# Patient Record
Sex: Female | Born: 1955 | Marital: Married | Smoking: Never smoker
Health system: Southern US, Academic
[De-identification: ages and names within clinical notes are randomized; demographics above are authoritative.]

## PROBLEM LIST (undated history)

## (undated) DIAGNOSIS — E119 Type 2 diabetes mellitus without complications: Secondary | ICD-10-CM

## (undated) DIAGNOSIS — K5190 Ulcerative colitis, unspecified, without complications: Secondary | ICD-10-CM

## (undated) DIAGNOSIS — R058 Other specified cough: Secondary | ICD-10-CM

## (undated) DIAGNOSIS — J301 Allergic rhinitis due to pollen: Secondary | ICD-10-CM

## (undated) DIAGNOSIS — K219 Gastro-esophageal reflux disease without esophagitis: Secondary | ICD-10-CM

## (undated) DIAGNOSIS — M1990 Unspecified osteoarthritis, unspecified site: Secondary | ICD-10-CM

## (undated) DIAGNOSIS — I82409 Acute embolism and thrombosis of unspecified deep veins of unspecified lower extremity: Secondary | ICD-10-CM

## (undated) DIAGNOSIS — I10 Essential (primary) hypertension: Secondary | ICD-10-CM

## (undated) DIAGNOSIS — T464X5A Adverse effect of angiotensin-converting-enzyme inhibitors, initial encounter: Secondary | ICD-10-CM

## (undated) DIAGNOSIS — J45909 Unspecified asthma, uncomplicated: Secondary | ICD-10-CM

## (undated) DIAGNOSIS — F339 Major depressive disorder, recurrent, unspecified: Secondary | ICD-10-CM

## (undated) DIAGNOSIS — R05 Cough: Secondary | ICD-10-CM

## (undated) DIAGNOSIS — K5090 Crohn's disease, unspecified, without complications: Secondary | ICD-10-CM

## (undated) HISTORY — PX: ABDOMINAL HYSTERECTOMY: SHX81

## (undated) HISTORY — PX: CATARACT EXTRACTION: SUR2

## (undated) HISTORY — PX: BREAST BIOPSY: SHX20

## (undated) HISTORY — PX: COLONOSCOPY: SHX174

## (undated) HISTORY — DX: Essential (primary) hypertension: I10

## (undated) HISTORY — DX: Unspecified asthma, uncomplicated: J45.909

## (undated) HISTORY — PX: KNEE SURGERY: SHX244

## (undated) HISTORY — DX: Other specified cough: R05.8

## (undated) HISTORY — DX: Gastro-esophageal reflux disease without esophagitis: K21.9

## (undated) HISTORY — DX: Morbid (severe) obesity due to excess calories: E66.01

## (undated) HISTORY — DX: Cough: R05

## (undated) HISTORY — DX: Type 2 diabetes mellitus without complications: E11.9

## (undated) HISTORY — DX: Major depressive disorder, recurrent, unspecified: F33.9

## (undated) HISTORY — DX: Adverse effect of angiotensin-converting-enzyme inhibitors, initial encounter: T46.4X5A

## (undated) HISTORY — DX: Ulcerative colitis, unspecified, without complications: K51.90

## (undated) HISTORY — DX: Allergic rhinitis due to pollen: J30.1

## (undated) HISTORY — PX: SHOULDER SURGERY: SHX246

## (undated) HISTORY — DX: Acute embolism and thrombosis of unspecified deep veins of unspecified lower extremity: I82.409

## (undated) HISTORY — DX: Type 2 diabetes mellitus without complications (CMS HCC): E11.9

## (undated) HISTORY — PX: HX SHOULDER SURGERY: 2100001311

## (undated) HISTORY — PX: HX KNEE SURGERY: 2100001320

## (undated) HISTORY — PX: HX CATARACT REMOVAL: SHX102

## (undated) HISTORY — DX: Crohn's disease, unspecified, without complications (CMS HCC): K50.90

## (undated) HISTORY — PX: HX HYSTERECTOMY: SHX81

---

## 1974-05-28 DIAGNOSIS — K51919 Ulcerative colitis, unspecified with unspecified complications: Secondary | ICD-10-CM | POA: Insufficient documentation

## 2014-09-07 LAB — CBC AND DIFFERENTIAL
HCT: 37 % (ref 36–46)
Hemoglobin: 11.4 g/dL — AB (ref 12.0–16.0)
NEUTROS ABS: 12 /uL
Platelets: 368 10*3/uL (ref 150–399)
WBC: 14.8 10^3/mL

## 2014-12-08 LAB — HEPATIC FUNCTION PANEL
ALK PHOS: 50 U/L (ref 25–125)
ALT: 20 U/L (ref 7–35)
AST: 14 U/L (ref 13–35)
Bilirubin, Total: 0.2 mg/dL

## 2014-12-08 LAB — BASIC METABOLIC PANEL
BUN: 27 mg/dL — AB (ref 4–21)
Creatinine: 1.4 mg/dL — AB (ref <=1.1)
GLUCOSE: 173 mg/dL
POTASSIUM: 4.7 mmol/L (ref 3.4–5.3)
SODIUM: 139 mmol/L (ref 137–147)

## 2015-02-23 LAB — HEMOGLOBIN A1C: HEMOGLOBIN A1C: 6.7

## 2015-02-23 LAB — HEPATIC FUNCTION PANEL
ALK PHOS: 75 U/L (ref 25–125)
ALT: 19 U/L (ref 7–35)
AST: 16 U/L (ref 13–35)
Bilirubin, Total: 0.2 mg/dL

## 2015-02-23 LAB — BASIC METABOLIC PANEL
BUN: 21 mg/dL (ref 4–21)
Creatinine: 1.2 mg/dL — AB (ref <=1.1)
Glucose: 122 mg/dL
Potassium: 4.5 mmol/L (ref 3.4–5.3)
Sodium: 139 mmol/L (ref 137–147)

## 2015-06-01 LAB — LIPID PANEL
CHOLESTEROL: 152 mg/dL (ref 0–200)
HDL: 51 mg/dL (ref 35–70)
LDL CALC: 69 mg/dL
Triglycerides: 159 mg/dL (ref 40–160)

## 2015-06-01 LAB — BASIC METABOLIC PANEL
BUN: 17 mg/dL (ref 4–21)
Creatinine: 1.2 mg/dL — AB (ref <=1.1)
GLUCOSE: 102 mg/dL
Potassium: 4.3 mmol/L (ref 3.4–5.3)
SODIUM: 141 mmol/L (ref 137–147)

## 2015-06-01 LAB — HEPATIC FUNCTION PANEL
ALT: 24 U/L (ref 7–35)
AST: 18 U/L (ref 13–35)
Alkaline Phosphatase: 89 U/L (ref 25–125)
Bilirubin, Total: 0.2 mg/dL

## 2015-06-01 LAB — HEMOGLOBIN A1C: Hemoglobin A1C: 6.7

## 2015-09-19 LAB — BASIC METABOLIC PANEL
BUN: 18 mg/dL (ref 4–21)
CREATININE: 1.2 mg/dL — AB (ref <=1.1)
Glucose: 77 mg/dL
SODIUM: 141 mmol/L (ref 137–147)

## 2015-09-19 LAB — HEPATIC FUNCTION PANEL
ALK PHOS: 111 U/L (ref 25–125)
ALT: 24 U/L (ref 7–35)
AST: 22 U/L (ref 13–35)
Bilirubin, Total: 0.4 mg/dL

## 2015-09-19 LAB — LIPID PANEL
CHOLESTEROL: 141 mg/dL (ref 0–200)
HDL: 47 mg/dL (ref 35–70)
LDL Cholesterol: 62 mg/dL
Triglycerides: 161 mg/dL — AB (ref 40–160)

## 2015-09-19 LAB — HEMOGLOBIN A1C: HEMOGLOBIN A1C: 6.4

## 2016-01-10 LAB — HEMOGLOBIN A1C: Hemoglobin A1C: 5.9

## 2016-01-10 LAB — LIPID PANEL
CHOLESTEROL: 132 mg/dL (ref 0–200)
HDL: 36 mg/dL (ref 35–70)
LDL CALC: 69 mg/dL
Triglycerides: 134 mg/dL (ref 40–160)

## 2016-01-10 LAB — BASIC METABOLIC PANEL
BUN: 25 mg/dL — AB (ref 4–21)
CREATININE: 1.2 mg/dL — AB (ref <=1.1)
Glucose: 81 mg/dL
POTASSIUM: 4.4 mmol/L (ref 3.4–5.3)
Sodium: 142 mmol/L (ref 137–147)

## 2016-01-10 LAB — VITAMIN D 25 HYDROXY (VIT D DEFICIENCY, FRACTURES): VIT D 25 HYDROXY: 44

## 2016-01-10 LAB — MICROALBUMIN, URINE: MICROALB UR: 12

## 2016-03-15 ENCOUNTER — Ambulatory Visit (HOSPITAL_BASED_OUTPATIENT_CLINIC_OR_DEPARTMENT_OTHER): Payer: BC Managed Care – PPO | Admitting: Rheumatology

## 2016-03-15 ENCOUNTER — Ambulatory Visit (HOSPITAL_BASED_OUTPATIENT_CLINIC_OR_DEPARTMENT_OTHER): Payer: BC Managed Care – PPO

## 2016-03-15 ENCOUNTER — Ambulatory Visit
Admission: RE | Admit: 2016-03-15 | Discharge: 2016-03-15 | Disposition: A | Discharge status: Home / Self Care | Payer: BC Managed Care – PPO | Source: Ambulatory Visit | Attending: Rheumatology | Admitting: Rheumatology

## 2016-03-15 ENCOUNTER — Other Ambulatory Visit (HOSPITAL_BASED_OUTPATIENT_CLINIC_OR_DEPARTMENT_OTHER): Payer: Self-pay | Admitting: Family

## 2016-03-15 ENCOUNTER — Encounter (HOSPITAL_BASED_OUTPATIENT_CLINIC_OR_DEPARTMENT_OTHER): Payer: Self-pay | Admitting: Family

## 2016-03-15 ENCOUNTER — Ambulatory Visit (HOSPITAL_BASED_OUTPATIENT_CLINIC_OR_DEPARTMENT_OTHER): Payer: BC Managed Care – PPO | Admitting: Family

## 2016-03-15 VITALS — BP 140/80 | HR 73 | Temp 97.5°F | Ht 59.57 in | Wt 186.7 lb

## 2016-03-15 DIAGNOSIS — Z79899 Other long term (current) drug therapy: Secondary | ICD-10-CM | POA: Insufficient documentation

## 2016-03-15 DIAGNOSIS — Z794 Long term (current) use of insulin: Secondary | ICD-10-CM | POA: Insufficient documentation

## 2016-03-15 DIAGNOSIS — R768 Other specified abnormal immunological findings in serum: Secondary | ICD-10-CM | POA: Insufficient documentation

## 2016-03-15 DIAGNOSIS — K529 Noninfective gastroenteritis and colitis, unspecified: Secondary | ICD-10-CM

## 2016-03-15 DIAGNOSIS — M25572 Pain in left ankle and joints of left foot: Secondary | ICD-10-CM

## 2016-03-15 DIAGNOSIS — M2550 Pain in unspecified joint: Secondary | ICD-10-CM

## 2016-03-15 DIAGNOSIS — Z6837 Body mass index (BMI) 37.0-37.9, adult: Secondary | ICD-10-CM

## 2016-03-15 DIAGNOSIS — M25571 Pain in right ankle and joints of right foot: Secondary | ICD-10-CM

## 2016-03-15 DIAGNOSIS — Z7951 Long term (current) use of inhaled steroids: Secondary | ICD-10-CM | POA: Insufficient documentation

## 2016-03-15 DIAGNOSIS — E1140 Type 2 diabetes mellitus with diabetic neuropathy, unspecified: Secondary | ICD-10-CM | POA: Insufficient documentation

## 2016-03-15 DIAGNOSIS — K5090 Crohn's disease, unspecified, without complications: Secondary | ICD-10-CM | POA: Insufficient documentation

## 2016-03-15 DIAGNOSIS — Z7952 Long term (current) use of systemic steroids: Secondary | ICD-10-CM | POA: Insufficient documentation

## 2016-03-15 DIAGNOSIS — Z791 Long term (current) use of non-steroidal anti-inflammatories (NSAID): Secondary | ICD-10-CM | POA: Insufficient documentation

## 2016-03-15 DIAGNOSIS — J45909 Unspecified asthma, uncomplicated: Secondary | ICD-10-CM | POA: Insufficient documentation

## 2016-03-15 DIAGNOSIS — M79641 Pain in right hand: Secondary | ICD-10-CM | POA: Insufficient documentation

## 2016-03-15 DIAGNOSIS — M79642 Pain in left hand: Secondary | ICD-10-CM | POA: Insufficient documentation

## 2016-03-15 LAB — CBC WITH DIFF
BASOPHIL #: 0.1 x10ˆ3/uL (ref 0.00–0.20)
BASOPHIL %: 1 %
EOSINOPHIL #: 1.02 x10ˆ3/uL — ABNORMAL HIGH (ref 0.00–0.50)
EOSINOPHIL %: 12 %
HCT: 36.5 % (ref 33.5–45.2)
HGB: 12.2 g/dL (ref 11.2–15.2)
LYMPHOCYTE #: 2.68 x10ˆ3/uL (ref 1.00–4.80)
LYMPHOCYTE %: 31 %
MCH: 29.8 pg (ref 27.4–33.0)
MCHC: 33.3 g/dL (ref 32.5–35.8)
MCV: 89.5 fL (ref 78.0–100.0)
MONOCYTE #: 0.63 x10ˆ3/uL (ref 0.30–1.00)
MONOCYTE %: 7 %
MPV: 9.2 fL (ref 7.5–11.5)
NEUTROPHIL #: 4.12 x10ˆ3/uL (ref 1.50–7.70)
NEUTROPHIL %: 48 %
PLATELETS: 303 x10ˆ3/uL (ref 140–450)
RBC: 4.08 x10ˆ6/uL (ref 3.63–4.92)
RDW: 13.5 % (ref 12.0–15.0)
WBC: 8.6 x10ˆ3/uL (ref 3.5–11.0)

## 2016-03-15 LAB — BUN: BUN: 25 mg/dL (ref 8–25)

## 2016-03-15 LAB — ALT (SGPT): ALT (SGPT): 13 U/L (ref <55)

## 2016-03-15 LAB — AST (SGOT): AST (SGOT): 21 U/L (ref 8–41)

## 2016-03-15 LAB — CREATININE WITH EGFR
CREATININE: 1.31 mg/dL — ABNORMAL HIGH (ref 0.49–1.10)
ESTIMATED GFR: 44 mL/min/1.73mˆ2 — ABNORMAL LOW (ref >59)

## 2016-03-15 LAB — C-REACTIVE PROTEIN(CRP),INFLAMMATION: CRP INFLAMMATION: 3.3 mg/L (ref <=8.0)

## 2016-03-15 LAB — ALBUMIN: ALBUMIN: 3.2 g/dL — ABNORMAL LOW (ref 3.4–4.8)

## 2016-03-15 LAB — CYCLIC CITRULLINATED PEPTIDE ANTIBODIES, IGG, SERUM: CYCLIC CITRULLINATED PEPTIDE ANTIBODY IGG QUANT: 0.7 U/mL (ref <5.0)

## 2016-03-15 LAB — SEDIMENTATION RATE: ERYTHROCYTE SEDIMENTATION RATE (ESR): 19 mm/h (ref 0–30)

## 2016-03-15 NOTE — H&P (Signed)
Requesting Physician: Wille Glaser, MD  History of Present Illness  Cathy Bennett is a 60 y.o. female who presents with a chief complaint of New Patient   to clinic.  Patient presents today for further evaluation of a positive ANA.  Patient presented to Dr. 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 Upon workup--ANA was discovered. Patient states she was previously elevated for + ANA in 2006 with no concern for lupus discovered.  Patient states she does have a history of Crohn's disease diagnosed in her late 71s.  She has been on various therapies throughout the years-- unable to tolerate Imuran or methotrexate.  States she was on sulfasalazine for many years with good control of inflammatory bowel symptoms.  Chronic prednisone use of varying doses for the past 40 years--was finally able to d/c in April 2016. It was at that time that her joint symptoms started.  She is currently seeing Dr. Mikeal Hawthorne for GI. Patient has recently moved to the area and has only been seen by Dr. Rito Ehrlich for a brief period.  Currently on Asacol and Protonix for her Crohn's disease. States Dr. Rito Ehrlich has mentioned possibly going back on Sulfasalazine.  She reports her Crohn's being well controlled.  She does not report any bleeding or diarrhea.  Did note an upset stomach last night following dinner with episodes of diarrhea--but otherwise states symptoms have been well controlled. Feeling better today.  She tells me her last colonoscopy was in October of 2016 and there was no concern for active Crohn's at that time.  In regards to her joints she says that it is her hands that are causing her the most trouble.  She describes increased pain and decreased range of motion.  Reports swelling to the MCPs.  She does report pain and numbness to her feet, but states she diabetic neuropathy. No reported back pain. She has morning stiffness lasting around 1 hour to her hands.  She says her hands do not feel better with  activity--hurt all day long.  She currently has been using ibuprofen with some relief. States she tolerates that okay and has not had any problems with her stomach while using ibuprofen.      Past History  Current Outpatient Prescriptions   Medication Sig    albuterol (PROVENTIL) 2.5 mg/0.5 mL Inhalation Solution for Nebulization 2.5 mg by Nebulization route Three times a day    buPROPion (WELLBUTRIN SR) 200 mg Oral Tablet Sustained Release 12 hr Take 100 mg by mouth Twice daily Takes 300 mg a day    cholecalciferol, vitamin D3, 1,000 unit Oral Tablet Take 1,000 Units by mouth Once a day    fluticasone (FLONASE) 50 mcg/actuation Nasal Spray, Suspension 1 Spray by Each Nostril route Once a day    fluticasone-salmeterol (ADVAIR) 100-50 mcg/dose Inhalation Disk with Device oral diskus inhaler Take 1 INHALATION by inhalation Once a day    insulin glargine (LANTUS) 100 unit/mL Subcutaneous injection (vial) by Subcutaneous route Once a day    insulin regular human 100 unit/mL Injection Solution by Subcutaneous route Three times daily before meals    Mesalamine (ASACOL HD) 800 mg Oral Tablet, Delayed Release (E.C.) Take 1,600 mg by mouth Three times a day 3 tablets in AM, 3 tabs in PM    metoprolol (LOPRESSOR) 100 mg Oral Tablet Take 100 mg by mouth Twice daily    montelukast (SINGULAIR) 10 mg Oral Tablet Take 10 mg by mouth Every evening    pantoprazole (PROTONIX) 40 mg Oral  Tablet, Delayed Release (E.C.) Take 40 mg by mouth Once a day    raNITIdine (ZANTAC) 300 mg Oral Tablet Take 300 mg by mouth Every evening     Allergies   Allergen Reactions    Benicar [Olmesartan]     Ceftin [Cefuroxime Axetil]     Codeine     Imuran [Azathioprine]     Indocin [Indomethacin]     Methotrexate      Past Medical History:   Diagnosis Date    Asthma     Crohn disease (HCC)     Diabetes mellitus, type 2 (HCC)          Past Surgical History:   Procedure Laterality Date    HX CATARACT REMOVAL      HX CESAREAN SECTION       HX HYSTERECTOMY      HX KNEE SURGERY Left     arthroscopy    HX SHOULDER SURGERY Left     rotator cuff repair         Family History  Family Medical History     None          Social History  Social History     Social History    Marital status: Married     Spouse name: N/A    Number of children: N/A    Years of education: N/A     Social History Main Topics    Smoking status: Never Smoker    Smokeless tobacco: Never Used    Alcohol use No    Drug use: Not on file    Sexual activity: Not on file     Other Topics Concern    Not on file     Social History Narrative    No narrative on file       Review of Systems    Bold is positive. Rest are negative    Constitutional: fevers, weight loss, weight gain, weight fluctuates, excessive fatigue, poor/unrefreshed sleep/difficulty sleeping, -sleep study, - snore  Eyes: Dryness (feels like something in the eye), hx of uveitis, vision problems (blurred or double vision) , glaucoma, cataracts--removed, macular degeneration, acute vision loss  Ears, nose, mouth, throat, and face: nosebleeds, sores in mouth, mouth dryness, sores in nose, hair loss, hearing loss, sinusitis/sinus issues, cavities or dentures  Respiratory: shortness of breath, cough, hemoptysis, hx of pleuritis, hx of pleural effusion, hx of COPD or asthma  Cardiovascular: chest pain, hx of pericardial effusion, hx of heart failure, hx of cardiac disease - stents  Gastrointestinal: difficulty swallowing, heart burn, diarrhea, blood in stool, constipation, Irritable bowel syndrome, inflammatory bowel syndrome --Crohn's  Genitourinary: blood in urine, hx of renal failure,  kidney stones  Integument: photosensitivity, hx of erythema nodosum, rash, hx of psoriasis  Hematologic/lymphatic: hx of blood clots, currently on blood thinners, hx of malignancy, currently on chemotherapy or radiation, hx of radiation, hx of chemotherapy  Musculoskeletal: as in HPI  Neurological: tingling, numbness--neuropathy, hx of  strokes, seizures, weakness, headaches - (migraines)  Behavioral/Psych: depression, anxiety, other psychiatric disorder  Endocrine: hx of diabetes, hx of thyroid disease                Other: pregnancy loss, raynaud's        Examination  Vitals: BP 140/80   Pulse 73   Temp 36.4 C (97.5 F) (Thermal Scan)    Ht 1.513 m (4' 11.57")   Wt 84.7 kg (186 lb 11.7 oz)   SpO2 98%   BMI  37 kg/m2  General: appears in good health, no acute distress  Eyes: Conjunctiva clear. Pupils equal and round.   HEENT: Mouth without ulceration.  Oral pooling is good. No sinus tenderness, no oral ulcers or lesions   Cardiovascular: Normal. Heart sounds are not distant  Lungs: clear to auscultation bilaterally. No wheezes  Abdomen: soft, non-tender and bowel sounds normal   Extremities: no cyanosis or edema, good pulses  Joints: Full ROM and no synovitis of the upper or lower extremities except as noted .   Joints examined: Hands, wrists, elbows, shoulders, neck, hips, knees, ankles, feet; abnormalities as below:   Hands: mild ulnar drift    R Full ROM, not painful, 2-3 MCP fullness, + MCP tenderness, no heberden's or bouchard's       L Full ROM, not painful, 2-3 MCP fullness, + MCP tenderness, no heberden's or bouchard's   Shoulders:    R decreased ROM, + painful, + tenderness    L Full ROM, not painful, no tenderness    Feet:    R Full ROM, no synovitis, no tenderness, metatarsal squeeze negative    L Full ROM, no synovitis, no tenderness, metatarsal squeeze negative, 2nd digit hammer toe  Back : No tenderness along the spine  Skin: No rashes or lesions,  (looked for malar rash, sclerodactyly, heliotrope, psoriasis, sclerodactyly, digital pits, digital ulcers, gottrons papules, nail pitting, periungal erythema, petechia, purpura and tophi)   Neurologic: Up to exam table without assistance  Proximal muscle strength grossly intact.   Psychiatric: affect normal    Outside Records:  December 21, 2015 ANA by a phase positive RA latex is less than  10. Negative.    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  Atrophy of the musculature of the supraspinatus and subscapularis is significant.  X-ray shows high riding humeral head, previous distal clavicle excision, and subacromial decompression.  We discussed with Cathy Bennett that she has a rotator cuff arthropathy that is amenable in the future but if it becomes painful enough to do a reverse shoulder replacement repair.    Diagnosis and Plan    1. Arthralgia, unspecified joint    2. Inflammatory bowel disease      Cathy Bennett is a 60 y/o female who presents today for further evaluation of + ANA and bilateral hand pain with swelling. Patient has a long history of Crohn's disease. Currently on Asacol and Protonix. Was weaned off chronic prednisone in April 2016 and states that is when pain in hands started.     On exam, tenderness to MCPs. Subtle fullness to bilateral 2-3rd MCPs.     Outside records reveal negative RF, + ANA via IFA. My current suspicion for connective tissue disease is low as patient does not present with any concerning features at this time. I suspect ANA likely related to diagnosis of Crohn's as ANA can present in inflammatory bowel disease. Will hold off on checking serologies today-- can be considered in the future if indicated. Discussed with patient that when inflammatory bowel disease is active--can lead to joint manifestations of inflammatory arthritis. Patient reports doing well with Sulfasalazine previously. Will complete further workup today including blood work and imaging of hands and feet. Patient signed release to collect most recent records from GI.     I would recommend DEXA per PCP if not previously checked given hx of long  term steroid use for osteoporosis screening.    RTC in 2-3 weeks to review results.       Orders  Placed This Encounter    XR FEET WT BEARING BILATERAL    XR HAND AND WRIST BI, ARTHRITIS SERIES    ALBUMIN    ALT (SGPT)    AST (SGOT)    BUN    CBC/DIFF    CREATININE    C-REACTIVE PROTEIN(CRP),INFLAMMATION    CYCLIC CITRULLINATED PEPTIDE ANTIBODIES, IGG, SERUM    SEDIMENTATION RATE    C3 COMPLEMENT    C4 COMPLEMENT    HEP-2 SUBSTRATE ANTINUCLEAR ANTIBODIES (ANA), SERUM     The patient was seen independently with co signing facutly present in clinic .  Benard Rink, APRN,NP-C  03/15/2016, 09:59      This note was partially generated using MModal Fluency Direct system, and there may be some incorrect words, spellings, and punctuation that were not noted in checking the note before saving.

## 2016-03-16 LAB — C4 COMPLEMENT, SERUM: C4 COMPLEMENT: 32 mg/dL (ref 12–39)

## 2016-03-19 ENCOUNTER — Encounter (HOSPITAL_BASED_OUTPATIENT_CLINIC_OR_DEPARTMENT_OTHER): Payer: Self-pay | Admitting: Family

## 2016-03-28 ENCOUNTER — Telehealth (HOSPITAL_BASED_OUTPATIENT_CLINIC_OR_DEPARTMENT_OTHER): Payer: Self-pay | Admitting: Family

## 2016-03-28 NOTE — Telephone Encounter (Signed)
Office note received dated for 03/14/2016: Dr. Mikeal Hawthorne MD    The patient is a 60 year old female who presents for a follow-up visit.  The patient's primary care physician is Dr. for bath.  Patient is being followed for follow-up of Crohn's and colitis.  Clerance Lav is doing well.  No problems or complaints.  No diarrhea, abdominal pain, bleeding or anything else.  She is feeling good.  She is taking her 6 Asacol at day.  She is taking her Protonix.  She is taking 40 b.i.d. but actually she has had no reflux symptoms and it might be from other drugs.  I am going to have her reduce it to once a day.  She continues to get it once a day before the evening meal then my thought would be to maybe even reduced to 20 mg per day.  She is going to experiment with that.  Everything is going to stay the same for her Crohn's.  At this point we are not going to change her meds.  I am going to do a CBC today but I do not think that we really need to do anything else.

## 2016-04-06 ENCOUNTER — Ambulatory Visit: Payer: BC Managed Care – PPO | Attending: Rheumatology | Admitting: Family

## 2016-04-06 ENCOUNTER — Encounter (HOSPITAL_BASED_OUTPATIENT_CLINIC_OR_DEPARTMENT_OTHER): Payer: Self-pay | Admitting: Family

## 2016-04-06 VITALS — BP 118/60 | HR 80 | Temp 97.7°F | Wt 185.8 lb

## 2016-04-06 DIAGNOSIS — M0760 Enteropathic arthropathies, unspecified site: Secondary | ICD-10-CM

## 2016-04-06 DIAGNOSIS — Z7952 Long term (current) use of systemic steroids: Secondary | ICD-10-CM | POA: Insufficient documentation

## 2016-04-06 DIAGNOSIS — K5090 Crohn's disease, unspecified, without complications: Secondary | ICD-10-CM | POA: Insufficient documentation

## 2016-04-06 DIAGNOSIS — Z791 Long term (current) use of non-steroidal anti-inflammatories (NSAID): Secondary | ICD-10-CM | POA: Insufficient documentation

## 2016-04-06 DIAGNOSIS — M8580 Other specified disorders of bone density and structure, unspecified site: Secondary | ICD-10-CM | POA: Insufficient documentation

## 2016-04-06 DIAGNOSIS — Z794 Long term (current) use of insulin: Secondary | ICD-10-CM | POA: Insufficient documentation

## 2016-04-06 DIAGNOSIS — Z79899 Other long term (current) drug therapy: Secondary | ICD-10-CM | POA: Insufficient documentation

## 2016-04-06 DIAGNOSIS — M2550 Pain in unspecified joint: Secondary | ICD-10-CM | POA: Insufficient documentation

## 2016-04-06 DIAGNOSIS — K639 Disease of intestine, unspecified: Secondary | ICD-10-CM

## 2016-04-06 DIAGNOSIS — Z6836 Body mass index (BMI) 36.0-36.9, adult: Secondary | ICD-10-CM

## 2016-04-06 MED ORDER — DICLOFENAC 1 % TOPICAL GEL
2.0000 g | Freq: Three times a day (TID) | CUTANEOUS | 2 refills | Status: AC | PRN
Start: 2016-04-06 — End: ?

## 2016-04-06 NOTE — Progress Notes (Signed)
Subjective  Cathy MainMaryann Bennett is a 60 y.o. year old female who presents for Lab Results   to clinic. Patient presents today for her first follow up appointment since establishing care on 03/15/2016. I have reviewed the initial history and ROS from first visit. Lab results as detailed below were reviewed with the patient today. States symptoms remain relatively stable. Uses ibuprofen regularly for the joint pain that primarily only affects her hands. States the hands continue to swell every few days. Feels ok with activity. States the ibuprofen does not cause problems with her Crohn's. Patient states her husband may have to transfer work locations and she may be moving by February.      Current Outpatient Prescriptions   Medication Sig    albuterol (PROVENTIL) 2.5 mg/0.5 mL Inhalation Solution for Nebulization 2.5 mg by Nebulization route Three times a day    buPROPion (WELLBUTRIN SR) 200 mg Oral Tablet Sustained Release 12 hr Take 100 mg by mouth Twice daily Takes 300 mg a day    cholecalciferol, vitamin D3, 1,000 unit Oral Tablet Take 1,000 Units by mouth Once a day    diclofenac sodium (VOLTAREN) 1 % Gel 2 g by Apply Topically route Three times a day as needed    fluticasone (FLONASE) 50 mcg/actuation Nasal Spray, Suspension 1 Spray by Each Nostril route Once a day    fluticasone-salmeterol (ADVAIR) 100-50 mcg/dose Inhalation Disk with Device oral diskus inhaler Take 1 INHALATION by inhalation Once a day    insulin glargine (LANTUS) 100 unit/mL Subcutaneous injection (vial) by Subcutaneous route Once a day    insulin regular human 100 unit/mL Injection Solution by Subcutaneous route Three times daily before meals    Mesalamine (ASACOL HD) 800 mg Oral Tablet, Delayed Release (E.C.) Take 1,600 mg by mouth Three times a day 3 tablets in AM, 3 tabs in PM    metoprolol (LOPRESSOR) 100 mg Oral Tablet Take 100 mg by mouth Twice daily    montelukast (SINGULAIR) 10 mg Oral Tablet Take 10 mg by mouth Every evening      pantoprazole (PROTONIX) 40 mg Oral Tablet, Delayed Release (E.C.) Take 40 mg by mouth Once a day    raNITIdine (ZANTAC) 300 mg Oral Tablet Take 300 mg by mouth Every evening       Objective  Vitals:    04/06/16 0959   BP: 118/60   Pulse: 80   Temp: 36.5 C (97.7 F)   TempSrc: Thermal Scan   SpO2: 98%   Weight: 84.3 kg (185 lb 13.6 oz)     General: appears in good health, no acute distress  Eyes: Conjunctiva clear. Pupils equal and round.   HEENT: Mouth without ulceration.  Oral pooling is good. No sinus tenderness, no oral ulcers or lesions   Cardiovascular: Normal. Heart sounds are not distant  Lungs: clear to auscultation bilaterally. No wheezes  Abdomen: soft, non-tender and bowel sounds normal   Extremities: no cyanosis or edema, good pulses  Joints: Full ROM and no synovitis of the upper or lower extremities except as noted .   Joints examined: Hands, wrists, elbows, shoulders, neck, hips, knees, ankles, feet; abnormalities as below:                         Hands: mild ulnar drift                          R Full ROM, not painful, 2-3  MCP thickening, + MCP tenderness, no heberden's or bouchard's                                           L Full ROM, not painful, 2-3 MCP thickening, + MCP tenderness, no heberden's or bouchard's                         Shoulders:                          R decreased ROM, + painful, + tenderness                         L Full ROM, not painful, no tenderness                          Feet:                          R Full ROM, no synovitis, no tenderness, metatarsal squeeze negative                          L Full ROM, no synovitis, no tenderness, metatarsal squeeze negative, 2nd digit hammer toe  Back : No tenderness along the spine  Skin: No rashes or lesions,  (looked for malar rash, sclerodactyly, heliotrope, psoriasis, sclerodactyly, digital pits, digital ulcers, gottrons papules, nail pitting, periungal erythema, petechia, purpura and tophi)   Neurologic: Up to exam table  without assistance  Proximal muscle strength grossly intact.   Psychiatric: affect normal    Appointment on 03/15/2016   Component Date Value Ref Range Status    ALBUMIN 03/15/2016 3.2* 3.4 - 4.8 g/dL Final    ALT (SGPT) 40/98/1191 13  <55 U/L Final    AST (SGOT) 03/15/2016 21  8 - 41 U/L Final    BUN 03/15/2016 25  8 - 25 mg/dL Final    CREATININE 47/82/9562 1.31* 0.49 - 1.10 mg/dL Final    ESTIMATED GFR 03/15/2016 44* >59 mL/min/1.31m2 Final    CRP INFLAMMATION 03/15/2016 3.3  <=8.0 mg/L Final    CYCLIC CITRULLINATED PEPTIDE ANTIB* 03/15/2016 0.7  <5.0 U/mL Final    ERYTHROCYTE SEDIMENTATION RATE (ES* 03/15/2016 19  0 - 30 mm/hr Final    C3 COMPLEMENT 03/15/2016 115  81 - 157 mg/dL Final    C4 COMPLEMENT 03/15/2016 32  12 - 39 mg/dL Final    ANA INTERPRETATION 03/15/2016 Negative  Negative Final    WBC 03/15/2016 8.6  3.5 - 11.0 x103/uL Final    RBC 03/15/2016 4.08  3.63 - 4.92 x106/uL Final    HGB 03/15/2016 12.2  11.2 - 15.2 g/dL Final    HCT 13/12/6576 36.5  33.5 - 45.2 % Final    MCV 03/15/2016 89.5  78.0 - 100.0 fL Final    MCH 03/15/2016 29.8  27.4 - 33.0 pg Final    MCHC 03/15/2016 33.3  32.5 - 35.8 g/dL Final    RDW 46/96/2952 13.5  12.0 - 15.0 % Final    PLATELETS 03/15/2016 303  140 - 450 x103/uL Final    MPV 03/15/2016 9.2  7.5 - 11.5 fL Final    NEUTROPHIL % 03/15/2016 48  % Final  LYMPHOCYTE % 03/15/2016 31  % Final    MONOCYTE % 03/15/2016 7  % Final    EOSINOPHIL % 03/15/2016 12  % Final    BASOPHIL % 03/15/2016 1  % Final    NEUTROPHIL # 03/15/2016 4.12  1.50 - 7.70 x103/uL Final    LYMPHOCYTE # 03/15/2016 2.68  1.00 - 4.80 x103/uL Final    MONOCYTE # 03/15/2016 0.63  0.30 - 1.00 x103/uL Final    EOSINOPHIL # 03/15/2016 1.02* 0.00 - 0.50 x103/uL Final    BASOPHIL # 03/15/2016 0.10  0.00 - 0.20 x103/uL Final     XR FEET BILATERAL 2 VIEW performed on 03/15/2016 11:44 AM     INDICATION: 60 years old Female; M25.50: Arthralgia, unspecified joint  K52.9:  Inflammatory bowel disease     TECHNIQUE: 2 views of each foot; 1 images     COMPARISON: None     FINDINGS: Osteopenia is noted. There are no articular erosions.  Degenerative arthrosis is present at the tarsometatarsal joints. There is  hindfoot valgus. No acute fracture is noted.     IMPRESSION:  1. No articular erosions.  2. Degenerative arthrosis of the tarsometatarsal joints.  XR HAND AND WRIST BI, ARTHRITIS SERIES performed on 03/15/2016 11:44 AM.     REASON FOR EXAM:  M25.50: Arthralgia, unspecified joint  K52.9: Inflammatory bowel disease     TECHNIQUE: 2 views/2 images submitted for interpretation.     COMPARISON:  None available     FINDINGS:  Cystic articular erosions are seen at the second through fifth  metacarpal heads bilaterally. No articular erosion seen in the wrists, but  significant joint space loss and subchondral sclerosis are detected at the  bilateral lunocapitate articulations. There is mild left and moderate right  first CMC degenerative arthropathy. No fracture is seen. There is  subluxations at the second through fourth MCPs bilaterally.     IMPRESSION:  Cystic articular erosions and subluxation bilaterally at the second through  fifth MCPs, which are nonspecific but could be seen with inflammatory bowel  disease related inflammatory arthropathy or rheumatoid arthritis in the  appropriate clinical setting.  XR FEET BILATERAL 2 VIEW performed on 03/15/2016 11:44 AM     INDICATION: 60 years old Female; M25.50: Arthralgia, unspecified joint  K52.9: Inflammatory bowel disease     TECHNIQUE: 2 views of each foot; 1 images     COMPARISON: None     FINDINGS: Osteopenia is noted. There are no articular erosions.  Degenerative arthrosis is present at the tarsometatarsal joints. There is  hindfoot valgus. No acute fracture is noted.     IMPRESSION:  1. No articular erosions.  2. Degenerative arthrosis of the tarsometatarsal joints.  XR HAND AND WRIST BI, ARTHRITIS SERIES performed on 03/15/2016  11:44 AM.     REASON FOR EXAM:  M25.50: Arthralgia, unspecified joint  K52.9: Inflammatory bowel disease     TECHNIQUE: 2 views/2 images submitted for interpretation.     COMPARISON:  None available     FINDINGS:  Cystic articular erosions are seen at the second through fifth  metacarpal heads bilaterally. No articular erosion seen in the wrists, but  significant joint space loss and subchondral sclerosis are detected at the  bilateral lunocapitate articulations. There is mild left and moderate right  first CMC degenerative arthropathy. No fracture is seen. There is  subluxations at the second through fourth MCPs bilaterally.     IMPRESSION:  Cystic articular erosions and subluxation bilaterally at the second through  fifth MCPs, which are nonspecific but could be seen with inflammatory bowel  disease related inflammatory arthropathy or rheumatoid arthritis in the  appropriate clinical setting    Assessment/Plan  1. Inflammatory bowel arthritis        Ms. Zamudio is a 60 y/o female who presents today to review lab results. Initially evaluated for  + ANA and bilateral hand pain with swelling. Patient has a long history of Crohn's disease. Currently on Asacol and Protonix. Was weaned off chronic prednisone in April 2016 and states that is when pain in hands started.      On exam, tenderness to MCPs. Subtle fullness/thickening to bilateral 2-3rd MCPs.     Workup reveals negative ANA, negative CCP (outside RF was negative), inflammation markers were normal. Imaging of hands with nonspecific findings of inflammatory arthropathy that could relate to hx of inflammatory bowel disease or rheumatoid arthritis. There have been no specific findings to suggest rheumatoid arthritis over inflammatory bowel arthropathy.     At this point--it's difficult to determine if changes identified on imaging are old or new; however patient does have MCP tenderness and reported swelling. Discussed with patient that when inflammatory bowel  disease is active--can lead to joint manifestations of inflammatory arthritis. Patient believes her Crohn's is well controlled.  Patient reports doing well with Sulfasalazine previously. Discussed other medications such as Humira and provided patient with handout. Patient states she would prefer to try sulfasalazine before moving forward with biologic medication. I would like for her GI doctor to be aware and involved in process before adding and/or changing any medication that may affect her Crohn's--currently on Asacol. Patient will schedule to see her Gi for further discussion.    Discussed daily Ibuprofen use with patient--risks with her increased creatinine and hx of Crohn's. Will prescribe Voltaren gel to avoid oral NSAID.     I would recommend DEXA per PCP if not previously checked given hx of long term steroid use for osteoporosis screening.    RTC following appointment with GI.       Orders Placed This Encounter    diclofenac sodium (VOLTAREN) 1 % Gel       The patient was seen independently with co signing facutly present in clinic       Benard Rink, APRN,NP-C 04/06/2016, 12:05

## 2016-04-09 LAB — BASIC METABOLIC PANEL
BUN: 25 mg/dL — AB (ref 4–21)
Creatinine: 1.4 mg/dL — AB (ref <=1.1)
GLUCOSE: 80 mg/dL
POTASSIUM: 4.5 mmol/L (ref 3.4–5.3)
SODIUM: 140 mmol/L (ref 137–147)

## 2016-04-09 LAB — HEPATIC FUNCTION PANEL
ALT: 18 U/L (ref 7–35)
AST: 14 U/L (ref 13–35)
Alkaline Phosphatase: 97 U/L (ref 25–125)
BILIRUBIN, TOTAL: 0.2 mg/dL

## 2016-04-09 LAB — HEMOGLOBIN A1C: HEMOGLOBIN A1C: 5.7

## 2016-09-11 ENCOUNTER — Encounter: Payer: Self-pay | Admitting: *Deleted

## 2016-09-11 ENCOUNTER — Telehealth: Payer: Self-pay | Admitting: *Deleted

## 2016-09-11 NOTE — Telephone Encounter (Signed)
PreVisit Call completed. Pt states that she had an extensive hospital stay about 2 years ago due to DKA. States that she is finally feeling recovered. 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 States she is finally off of Prednisone. No acute complaints. Recently moved to the area and will need referral to GI and Endocrinology. States she had Tdap and PCV 13 5 years ago.

## 2016-09-12 ENCOUNTER — Encounter: Payer: Self-pay | Admitting: Family Medicine

## 2016-09-12 ENCOUNTER — Other Ambulatory Visit: Payer: Self-pay | Admitting: Family Medicine

## 2016-09-12 ENCOUNTER — Ambulatory Visit (INDEPENDENT_AMBULATORY_CARE_PROVIDER_SITE_OTHER): Payer: BLUE CROSS/BLUE SHIELD | Admitting: Family Medicine

## 2016-09-12 VITALS — BP 134/78 | HR 63 | Temp 98.2°F | Ht 59.0 in | Wt 192.4 lb

## 2016-09-12 DIAGNOSIS — Z1231 Encounter for screening mammogram for malignant neoplasm of breast: Secondary | ICD-10-CM

## 2016-09-12 DIAGNOSIS — K51919 Ulcerative colitis, unspecified with unspecified complications: Secondary | ICD-10-CM

## 2016-09-12 DIAGNOSIS — E118 Type 2 diabetes mellitus with unspecified complications: Secondary | ICD-10-CM | POA: Diagnosis not present

## 2016-09-12 DIAGNOSIS — Z794 Long term (current) use of insulin: Secondary | ICD-10-CM

## 2016-09-12 DIAGNOSIS — M25511 Pain in right shoulder: Secondary | ICD-10-CM | POA: Diagnosis not present

## 2016-09-12 DIAGNOSIS — E119 Type 2 diabetes mellitus without complications: Secondary | ICD-10-CM | POA: Diagnosis not present

## 2016-09-12 DIAGNOSIS — G8929 Other chronic pain: Secondary | ICD-10-CM

## 2016-09-12 DIAGNOSIS — Z1239 Encounter for other screening for malignant neoplasm of breast: Secondary | ICD-10-CM

## 2016-09-12 LAB — MICROALBUMIN / CREATININE URINE RATIO
Creatinine,U: 101 mg/dL
Microalb Creat Ratio: 0.7 mg/g (ref 0.0–30.0)
Microalb, Ur: <0.7 mg/dL (ref 0.0–1.9)

## 2016-09-12 LAB — COMPREHENSIVE METABOLIC PANEL
ALT: 11 U/L (ref 0–35)
AST: 19 U/L (ref 0–37)
Albumin: 3.7 g/dL (ref 3.5–5.2)
Alkaline Phosphatase: 76 U/L (ref 39–117)
BUN: 25 mg/dL — ABNORMAL HIGH (ref 6–23)
CO2: 24 mEq/L (ref 19–32)
Calcium: 9.1 mg/dL (ref 8.4–10.5)
Chloride: 108 mEq/L (ref 96–112)
Creatinine, Ser: 1.31 mg/dL — ABNORMAL HIGH (ref 0.40–1.20)
GFR: 43.73 mL/min — ABNORMAL LOW (ref >60.00)
Glucose, Bld: 89 mg/dL (ref 70–99)
Potassium: 4.6 mEq/L (ref 3.5–5.1)
Sodium: 138 mEq/L (ref 135–145)
Total Bilirubin: 0.3 mg/dL (ref 0.2–1.2)
Total Protein: 6.4 g/dL (ref 6.0–8.3)

## 2016-09-12 LAB — CBC
HCT: 36.3 % (ref 36.0–46.0)
Hemoglobin: 11.8 g/dL — ABNORMAL LOW (ref 12.0–15.0)
MCHC: 32.6 g/dL (ref 30.0–36.0)
MCV: 87.7 fl (ref 78.0–100.0)
Platelets: 330 10*3/uL (ref 150.0–400.0)
RBC: 4.14 Mil/uL (ref 3.87–5.11)
RDW: 14.6 % (ref 11.5–15.5)
WBC: 8.9 10*3/uL (ref 4.0–10.5)

## 2016-09-12 LAB — LIPID PANEL
Cholesterol: 149 mg/dL (ref 0–200)
HDL: 45.7 mg/dL (ref >39.00)
LDL Cholesterol: 82 mg/dL (ref 0–99)
NonHDL: 103.34
Total CHOL/HDL Ratio: 3
Triglycerides: 109 mg/dL (ref 0.0–149.0)
VLDL: 21.8 mg/dL (ref 0.0–40.0)

## 2016-09-12 LAB — POCT GLYCOSYLATED HEMOGLOBIN (HGB A1C): Hemoglobin A1C: 5.6

## 2016-09-12 NOTE — Progress Notes (Signed)
Pre visit review using our clinic review tool, if applicable. No additional management support is needed unless otherwise documented below in the visit note. 

## 2016-09-12 NOTE — Progress Notes (Signed)
Tara Garner is a 61 y.o. female is here to [place].   History of Present Illness:   Shaune Pascal CMA acting as scribe for Dr. Juleen China.  HPI:  1. Controlled type 2 diabetes mellitus. Current symptoms: No polyuria, polydipsia, blurry vision, chest pain, dyspnea or claudication.  Taking medication compliantly without noted sided effects. No episodes of hypoglycemia. On ACE inhibitor or angiotensin II receptor blocker?  No. On Aspirin? Yes.   3. Ulcerative colitis with complication, unspecified location [facility]). Stable. Needs referral to GI.   4. Chronic right shoulder pain. Worsening. Frozen. Injury years ago, without rehab. Now, with pain chronically and inability to abduct. No paresthesias.     Health Maintenance Due  Topic Date Due  . Hepatitis C Screening  November 21, 1955  . PNEUMOCOCCAL POLYSACCHARIDE VACCINE (1) 09/25/1956  . FOOT EXAM  09/25/1964  . OPHTHALMOLOGY EXAM  09/25/1964  . HIV Screening  09/25/1969  . TETANUS/TDAP  09/25/1973  . PAP SMEAR  09/26/1975  . MAMMOGRAM  09/25/2004  . COLONOSCOPY  09/25/2004    PMHx, SurgHx, SocialHx, Medications, and Allergies were reviewed in the Visit Navigator and updated as appropriate.   Past Medical History:  Diagnosis Date  . Asthma   . Depression, recurrent ([place]) 09/16/2016  . Diabetes mellitus without complication ([place])   . Hypertension   . Morbid obesity ([place]) 09/16/2016  . Ulcerative colitis [facility])     Past Surgical History:  Procedure Laterality Date  . ABDOMINAL HYSTERECTOMY    . CESAREAN SECTION    . KNEE SURGERY Left   . SHOULDER SURGERY Right     Family History  Problem Relation Age of Onset  . Diabetes Mother   . Dementia Mother   . Cancer Brother   . Diverticulitis Brother    Social History  Substance Use Topics  . Smoking status: Never Smoker  . Smokeless tobacco: Never Used  . Alcohol use No   Current Medications and Allergies:   .  albuterol (PROVENTIL HFA;VENTOLIN HFA) 108 (90 Base)  MCG/ACT inhaler, Inhale 2 puffs into the lungs every 6 (six) hours as needed for wheezing or shortness of breath., Disp: , Rfl:  .  aspirin EC 81 MG tablet, Take 81 mg by mouth daily., Disp: , Rfl:  .  buPROPion (WELLBUTRIN XL) 300 MG 24 hr tablet, Take 300 mg by mouth daily., Disp: , Rfl:  .  Calcium Carbonate-Vitamin D (CALCIUM 500/D PO), Take 500 mg by mouth daily., Disp: , Rfl:  .  cholecalciferol (VITAMIN D) 1000 units tablet, Take 4,000 Units by mouth daily., Disp: , Rfl:  .  fluticasone (FLONASE) 50 MCG/ACT nasal spray, Place into both nostrils daily as needed for allergies or rhinitis., Disp: , Rfl:  .  Fluticasone-Salmeterol (ADVAIR) 250-50 MCG/DOSE AEPB, Inhale 1 puff into the lungs 2 (two) times daily as needed., Disp: , Rfl:  .  insulin glargine (LANTUS) 100 UNIT/ML injection, Inject 22 Units into the skin daily., Disp: , Rfl:  .  insulin lispro (HUMALOG) 100 UNIT/ML injection, Inject into the skin 3 (three) times daily before meals., Disp: , Rfl:  .  Mesalamine (ASACOL PO), Take 2,400 mg by mouth 2 (two) times daily., Disp: , Rfl:  .  metoprolol (LOPRESSOR) 100 MG tablet, Take 100 mg by mouth 2 (two) times daily., Disp: , Rfl:  .  montelukast (SINGULAIR) 10 MG tablet, Take 10 mg by mouth at bedtime., Disp: , Rfl:  .  pantoprazole (PROTONIX) 40 MG tablet, Take 40 mg by mouth  2 (two) times daily., Disp: , Rfl:   Review of Systems:   Review of Systems  Constitutional: Negative for chills, fever and malaise/fatigue.  HENT: Negative for congestion, ear pain, sinus pain and sore throat.   Eyes: Negative for double vision.  Respiratory: Negative for cough, shortness of breath and wheezing.   Cardiovascular: Negative for chest pain, palpitations and leg swelling.  Gastrointestinal: Negative for abdominal pain, constipation, diarrhea and vomiting.  Genitourinary: Negative for dysuria.  Musculoskeletal: Positive for back pain. Negative for joint pain and neck pain.  Skin: Negative for  itching and rash.  Neurological: Negative for headaches.  Psychiatric/Behavioral: Negative for depression, hallucinations and memory loss.    Vitals:   Vitals:   09/12/16 1058  BP: 134/78  Pulse: 63  Temp: 98.2 F (36.8 C)  TempSrc: Oral  SpO2: 98%  Weight: 192 lb 6.4 oz (87.3 kg)  Height: 4' 11"  (1.499 m)     Body mass index is 38.86 kg/m.  Physical Exam:   Physical Exam  Constitutional: She appears well-developed and well-nourished. No distress.  HENT:  Head: Normocephalic and atraumatic.  Eyes: EOM are normal. Pupils are equal, round, and reactive to light.  Neck: Normal range of motion. Neck supple.  Cardiovascular: Normal rate, regular rhythm, normal heart sounds and intact distal pulses.   Pulmonary/Chest: Effort normal.  Abdominal: Soft.  Skin: Skin is warm.  Psychiatric: She has a normal mood and affect. Her behavior is normal.  Nursing note and vitals reviewed.  Results for orders placed or performed in visit on 09/12/16  Comprehensive metabolic panel  Result Value Ref Range   Sodium 138 135 - 145 mEq/L   Potassium 4.6 3.5 - 5.1 mEq/L   Chloride 108 96 - 112 mEq/L   CO2 24 19 - 32 mEq/L   Glucose, Bld 89 70 - 99 mg/dL   BUN 25 (H) 6 - 23 mg/dL   Creatinine, Ser 1.31 (H) 0.40 - 1.20 mg/dL   Total Bilirubin 0.3 0.2 - 1.2 mg/dL   Alkaline Phosphatase 76 39 - 117 U/L   AST 19 0 - 37 U/L   ALT 11 0 - 35 U/L   Total Protein 6.4 6.0 - 8.3 g/dL   Albumin 3.7 3.5 - 5.2 g/dL   Calcium 9.1 8.4 - 10.5 mg/dL   GFR 43.73 (L) >60.00 mL/min  CBC  Result Value Ref Range   WBC 8.9 4.0 - 10.5 K/uL   RBC 4.14 3.87 - 5.11 Mil/uL   Platelets 330.0 150.0 - 400.0 K/uL   Hemoglobin 11.8 (L) 12.0 - 15.0 g/dL   HCT 36.3 36.0 - 46.0 %   MCV 87.7 78.0 - 100.0 fl   MCHC 32.6 30.0 - 36.0 g/dL   RDW 14.6 11.5 - 15.5 %  Lipid panel  Result Value Ref Range   Cholesterol 149 0 - 200 mg/dL   Triglycerides 109.0 0.0 - 149.0 mg/dL   HDL 45.70 >39.00 mg/dL   VLDL 21.8 0.0 -  40.0 mg/dL   LDL Cholesterol 82 0 - 99 mg/dL   Total CHOL/HDL Ratio 3    NonHDL 103.34   Microalbumin / creatinine urine ratio  Result Value Ref Range   Microalb, Ur <0.7 0.0 - 1.9 mg/dL   Creatinine,U 101.0 mg/dL   Microalb Creat Ratio 0.7 0.0 - 30.0 mg/g  POCT glycosylated hemoglobin (Hb A1C)  Result Value Ref Range   Hemoglobin A1C 5.6     Assessment and Plan:    Shevette was seen today  for establish care and foot pain.  Diagnoses and all orders for this visit:  Controlled type 2 diabetes mellitus with complication, with long-term current use of insulin ([place]) Comments: Creatinine elevated today. Possibly some AKI instead of or on CKD. Will recheck when hydrated. Not on ACE/ARB. Not on statin.  Orders: -     POCT glycosylated hemoglobin (Hb A1C) -     Comprehensive metabolic panel -     Lipid panel -     Microalbumin / creatinine urine ratio -     Ambulatory referral to Endocrinology  Screening for breast cancer -     MM SCREENING BREAST W/IMPLANT TOMO BILATERAL; Future  Ulcerative colitis with complication, unspecified location ([place]) -     CBC -     Ambulatory referral to Gastroenterology  Chronic right shoulder pain Comments: Worsening. To [place]. Likely to PT as well. Orders: -     Ambulatory referral to Sports Medicine  Morbid obesity [facility]) Comments: The patient is asked to make an attempt to improve diet and exercise patterns to aid in medical management of this problem.  Orders: -     Lipid panel    . Reviewed expectations re: course of current medical issues. . Discussed self-management of symptoms. . Outlined signs and symptoms indicating need for more acute intervention. . Patient verbalized understanding and all questions were answered. . See orders for this visit as documented in the electronic medical record. . Patient received an After Visit Summary.  Records requested if needed. I spent 30 minutes with this patient, greater than 50% was  face-to-face time counseling regarding the above diagnoses.  CMA served as Education administrator during this visit. History, Physical, and Plan performed by medical provider. Documentation and orders reviewed and attested to. Briscoe Deutscher, D.O.  Briscoe Deutscher, [place], [place] 09/16/2016   Follow-up: Return in about 3 months (around 12/12/2016).  No orders of the defined types were placed in this encounter.  There are no discontinued medications. Orders Placed This Encounter  Procedures  . Comprehensive metabolic panel  . CBC  . Lipid panel  . Microalbumin / creatinine urine ratio  . Ambulatory referral to Sports Medicine  . Ambulatory referral to Gastroenterology  . Ambulatory referral to Endocrinology  . POCT glycosylated hemoglobin (Hb A1C)

## 2016-09-13 ENCOUNTER — Encounter: Payer: Self-pay | Admitting: Gastroenterology

## 2016-09-16 ENCOUNTER — Encounter: Payer: Self-pay | Admitting: Family Medicine

## 2016-09-16 DIAGNOSIS — N183 Chronic kidney disease, stage 3 unspecified: Secondary | ICD-10-CM | POA: Insufficient documentation

## 2016-09-16 DIAGNOSIS — I10 Essential (primary) hypertension: Secondary | ICD-10-CM

## 2016-09-16 DIAGNOSIS — E1159 Type 2 diabetes mellitus with other circulatory complications: Secondary | ICD-10-CM | POA: Insufficient documentation

## 2016-09-16 DIAGNOSIS — J4540 Moderate persistent asthma, uncomplicated: Secondary | ICD-10-CM | POA: Insufficient documentation

## 2016-09-16 DIAGNOSIS — F339 Major depressive disorder, recurrent, unspecified: Secondary | ICD-10-CM | POA: Insufficient documentation

## 2016-09-16 DIAGNOSIS — K219 Gastro-esophageal reflux disease without esophagitis: Secondary | ICD-10-CM | POA: Insufficient documentation

## 2016-09-16 DIAGNOSIS — I152 Hypertension secondary to endocrine disorders: Secondary | ICD-10-CM | POA: Insufficient documentation

## 2016-09-16 DIAGNOSIS — E1122 Type 2 diabetes mellitus with diabetic chronic kidney disease: Secondary | ICD-10-CM | POA: Insufficient documentation

## 2016-09-16 DIAGNOSIS — J301 Allergic rhinitis due to pollen: Secondary | ICD-10-CM | POA: Insufficient documentation

## 2016-09-16 DIAGNOSIS — M25511 Pain in right shoulder: Secondary | ICD-10-CM

## 2016-09-16 DIAGNOSIS — G8929 Other chronic pain: Secondary | ICD-10-CM | POA: Insufficient documentation

## 2016-09-16 DIAGNOSIS — K51919 Ulcerative colitis, unspecified with unspecified complications: Secondary | ICD-10-CM

## 2016-09-16 HISTORY — DX: Allergic rhinitis due to pollen: J30.1

## 2016-09-16 HISTORY — DX: Morbid (severe) obesity due to excess calories: E66.01

## 2016-09-16 HISTORY — DX: Major depressive disorder, recurrent, unspecified: F33.9

## 2016-09-19 ENCOUNTER — Telehealth: Payer: Self-pay

## 2016-09-19 ENCOUNTER — Telehealth: Payer: Self-pay | Admitting: Family Medicine

## 2016-09-19 NOTE — Telephone Encounter (Signed)
Patient notified.  States she will drink more water.  Admits that she hasn't been drinking as much as she should lately.  Will call when she is back in town to schedule lab appointment for repeat CMP.

## 2016-09-19 NOTE — Telephone Encounter (Signed)
ROI faxed to Dr. Glee Arvin.

## 2016-09-19 NOTE — Telephone Encounter (Signed)
-----   Message from Briscoe Deutscher, DO sent at 09/16/2016  7:28 PM EDT ----- Call patient. Creatinine elevated (kidney function). Possibly dehydrated. I want her to hydrate well and come in for repeat lab at her convenience this week - bmp.

## 2016-09-26 ENCOUNTER — Telehealth: Payer: Self-pay | Admitting: Family Medicine

## 2016-09-26 NOTE — Telephone Encounter (Signed)
09/26/16 Received 112 pages from [employer] sent them interoffice mail to Briscoe Deutscher MD. At [facility]. PWR

## 2016-09-27 ENCOUNTER — Ambulatory Visit: Payer: BLUE CROSS/BLUE SHIELD | Admitting: Sports Medicine

## 2016-09-27 ENCOUNTER — Other Ambulatory Visit: Payer: Self-pay

## 2016-09-27 ENCOUNTER — Telehealth: Payer: Self-pay | Admitting: Family Medicine

## 2016-09-27 MED ORDER — BUPROPION HCL ER (XL) 300 MG PO TB24: 300.0000 mg | ORAL_TABLET | Freq: Every day | ORAL | 3 refills | Status: DC

## 2016-09-27 NOTE — Telephone Encounter (Signed)
Okay refill. 

## 2016-09-27 NOTE — Telephone Encounter (Signed)
Refill has been sent to patient's pharmacy.

## 2016-09-27 NOTE — Telephone Encounter (Signed)
Patient calling back to check on rx refill. I advised her that Dr. Juleen China has not seen it yet today. She just needs to hear back from someone before tomorrow at 10 due to going out of town for the weekend and would need the supply.

## 2016-09-27 NOTE — Telephone Encounter (Signed)
Please advise on refill.

## 2016-09-27 NOTE — Telephone Encounter (Signed)
**  Remind patient they can make refill requests via MyChart**  Medication refill request (Name & Dosage): buPROPion (WELLBUTRIN XL) 300 MG 24 hr tablet [[id number]]     Preferred pharmacy (Name & Address): [id number], [place] [phone] (Phone) [phone] (Fax)      Other comments (if applicable):

## 2016-10-01 ENCOUNTER — Encounter: Payer: Self-pay | Admitting: Family Medicine

## 2016-10-18 ENCOUNTER — Ambulatory Visit
Admission: RE | Admit: 2016-10-18 | Discharge: 2016-10-18 | Disposition: A | Discharge status: Home / Self Care | Payer: BLUE CROSS/BLUE SHIELD | Source: Ambulatory Visit | Attending: Family Medicine | Admitting: Family Medicine

## 2016-10-18 DIAGNOSIS — Z1231 Encounter for screening mammogram for malignant neoplasm of breast: Secondary | ICD-10-CM

## 2016-10-23 ENCOUNTER — Encounter: Payer: Self-pay | Admitting: Endocrinology

## 2016-10-23 ENCOUNTER — Ambulatory Visit (INDEPENDENT_AMBULATORY_CARE_PROVIDER_SITE_OTHER): Payer: BLUE CROSS/BLUE SHIELD | Admitting: Endocrinology

## 2016-10-23 ENCOUNTER — Encounter: Payer: Self-pay | Admitting: Gastroenterology

## 2016-10-23 ENCOUNTER — Ambulatory Visit (INDEPENDENT_AMBULATORY_CARE_PROVIDER_SITE_OTHER): Payer: BLUE CROSS/BLUE SHIELD | Admitting: Gastroenterology

## 2016-10-23 VITALS — BP 125/65 | HR 66 | Ht 59.0 in | Wt 193.0 lb

## 2016-10-23 VITALS — BP 130/70 | HR 72 | Ht 59.0 in | Wt 192.2 lb

## 2016-10-23 DIAGNOSIS — K5190 Ulcerative colitis, unspecified, without complications: Secondary | ICD-10-CM

## 2016-10-23 DIAGNOSIS — E1165 Type 2 diabetes mellitus with hyperglycemia: Secondary | ICD-10-CM

## 2016-10-23 DIAGNOSIS — E1142 Type 2 diabetes mellitus with diabetic polyneuropathy: Secondary | ICD-10-CM | POA: Diagnosis not present

## 2016-10-23 DIAGNOSIS — Z794 Long term (current) use of insulin: Secondary | ICD-10-CM

## 2016-10-23 MED ORDER — VICTOZA 18 MG/3ML ~~LOC~~ SOPN: 1.2000 mg | PEN_INJECTOR | Freq: Every day | SUBCUTANEOUS | 3 refills | Status: DC

## 2016-10-23 NOTE — Patient Instructions (Addendum)
Start VICTOZA injection as shown once daily at the same time of the day.   Dial the dose to 0.6 mg on the pen for the first week.    You may inject in the stomach, thigh or arm. You may experience nausea in the first few days which usually goes away.  You will feel fullness of the stomach with starting the medication and should try to keep the portions at meals small.  After 1 week increase the dose to 1.48m daily if no nausea present.   If any questions or concerns are present call the office or the [facility] at [phone] Visit [email or web address] more useful information  lantus 20 Units and adjust as directed

## 2016-10-23 NOTE — Progress Notes (Signed)
HPI: This is a  very pleasant 61 year old woman  who was referred to me by Briscoe Deutscher, DO  to evaluate  long-standing ulcerative colitis .    Chief complaint is ulcerative colitis  Recently moved: [place] to [facility] to here.  She was diagnoed with UC in 1970s (she was in early 49); was having a lot of diarrhea, abdominal pains.  Was with Dr. Kandee Keen in [place] for almost 40 years.  She was on prednisone for 38 years (doses ranging 5-7 at baseline).  At some point she tried imuran but side effects (a severe nagging cough).  Currently 848m asacol; 3 pills twice daily.    She was on prednisone until about 1 year ago.  She is pretty well  [place]  She thinks she's mainly had left sided colitis.  Her brother had colon cancer.  She does not need refills on asacol.   [place]  She usually has one BM daily, solid, non bloody.  Sometimes every other day.  Old Data Reviewed:  Blood work April 2018: CBC was normal except for slight normocytic anemia, complete about profile is normal except for creatinine 1.3     Review of systems: Pertinent positive and negative review of systems were noted in the above HPI section. All other review negative.   Past Medical History:  Diagnosis Date  . Acid reflux   . Asthma   . Cough due to ACE inhibitor   . Depression, recurrent ([place] 09/16/2016  . Diabetes mellitus without complication ([place]   . DVT (deep venous thrombosis) ([place]   . Hypertension   . Morbid obesity ([place] 09/16/2016  . Seasonal allergic rhinitis due to pollen 09/16/2016  . Sleep apnea   . Ulcerative colitis ([facility]     Past Surgical History:  Procedure Laterality Date  . ABDOMINAL HYSTERECTOMY    . BREAST BIOPSY Left    benign  . CATARACT EXTRACTION    . CESAREAN SECTION    . KNEE SURGERY Left   . SHOULDER SURGERY Right     Current Outpatient Prescriptions  Medication Sig Dispense  Refill  . albuterol (PROVENTIL HFA;VENTOLIN HFA) 108 (90 Base) MCG/ACT inhaler Inhale 2 puffs into the lungs every 6 (six) hours as needed for wheezing or shortness of breath.    .Marland Kitchenaspirin EC 81 MG tablet Take 81 mg by mouth daily.    .Marland KitchenbuPROPion (WELLBUTRIN XL) 300 MG 24 hr tablet Take 1 tablet (300 mg total) by mouth daily. 30 tablet 3  . Calcium Carbonate-Vitamin D (CALCIUM 500/D PO) Take 500 mg by mouth daily.    . cholecalciferol (VITAMIN D) 1000 units tablet Take 4,000 Units by mouth daily.    . fluticasone (FLONASE) 50 MCG/ACT nasal spray Place into both nostrils daily as needed for allergies or rhinitis.    . Fluticasone-Salmeterol (ADVAIR) 250-50 MCG/DOSE AEPB Inhale 1 puff into the lungs 2 (two) times daily as needed.    . insulin glargine (LANTUS) 100 UNIT/ML injection Inject 22 Units into the skin daily.    . insulin lispro (HUMALOG) 100 UNIT/ML injection Inject into the skin 3 (three) times daily before meals. Takes 6 units in the morning, takes about 4 units at lunch, and takes about 6 units at dinner, just depends on how much eats    . Mesalamine (ASACOL PO) Take 2,400 mg by mouth 2 (two) times daily.    .Marland Kitchen  metoprolol (LOPRESSOR) 100 MG tablet Take 100 mg by mouth 2 (two) times daily.    . montelukast (SINGULAIR) 10 MG tablet Take 10 mg by mouth at bedtime.    . pantoprazole (PROTONIX) 40 MG tablet Take 40 mg by mouth 2 (two) times daily.    . ranitidine (ZANTAC) 300 MG tablet Take 300 mg by mouth at bedtime.    Marland Kitchen VICTOZA 18 MG/3ML SOPN Inject 0.2 mLs (1.2 mg total) into the skin daily. Inject once daily at the same time 2 pen 3   No current facility-administered medications for this visit.     Allergies as of 10/23/2016 - Review Complete 10/23/2016  Allergen Reaction Noted  . Avelox [moxifloxacin]  09/11/2016  . Benicar [olmesartan]  09/11/2016  . Ceftin [cefuroxime]  09/11/2016  . Codeine  09/11/2016  . Imuran [azathioprine]  09/11/2016  . Indocin [indomethacin]  09/11/2016   . Methotrexate derivatives  09/11/2016    Family History  Problem Relation Age of Onset  . Diabetes Mother   . Dementia Mother   . Stroke Mother   . Coronary artery disease Mother   . Cancer Brother   . Diverticulitis Brother   . COPD Father   . Breast cancer Cousin     Social History   Social History  . Marital status: Married    Spouse name: N/A  . Number of children: N/A  . Years of education: N/A   Occupational History  . Not on file.   Social History Main Topics  . Smoking status: Never Smoker  . Smokeless tobacco: Never Used  . Alcohol use No  . Drug use: No  . Sexual activity: Not on file   Other Topics Concern  . Not on file   Social History Narrative  . No narrative on file     Physical Exam: Wt 192 lb 3.2 oz (87.2 kg)   BMI 38.82 kg/m  Constitutional: generally well-appearing Psychiatric: alert and oriented x3 Eyes: extraocular movements intact Mouth: oral pharynx moist, no lesions Neck: supple no lymphadenopathy Cardiovascular: heart regular rate and rhythm Lungs: clear to auscultation bilaterally Abdomen: soft, nontender, nondistended, no obvious ascites, no peritoneal signs, normal bowel sounds Extremities: no lower extremity edema bilaterally Skin: no lesions on visible extremities   Assessment and plan: 61 y.o. female with  Long-standing ulcerative colitis  We will try to get records from her previous GI doctors. I am most interested in her recent 2-4 colonoscopy reports plus associated pathology reports. 8 sounds like she has had ulcerative colitis for about 40 years now. 38 of those years she was on prednisone. I explained to her why my goal will be to keep her from ever getting back on prednisone. It sounds like her symptoms are well controlled currently on full dose oral mesalamine. She will continue taking that for now. I'm happy to take over any refills if she needs them. I'm also happy to refill her proton pump inhibitor if she needs  it. She does understand she will likely need high risk screening colonoscopies every 2-3 years or so. After reviewing her outside records we will decide on the correct time for her next colonoscopy. She will return to see me in 3 months and sooner if needed. I see no reason for any blood tests or imaging studies currently    Please see the "Patient Instructions" section for addition details about the plan.   Owens Loffler, MD [facility] 10/23/2016, 1:13 PM  Cc: Briscoe Deutscher, DO

## 2016-10-23 NOTE — Patient Instructions (Addendum)
We will get records sent from your previous gastroenterologists (in [facility] and [place])  for review.  This will include any endoscopic (colonoscopy or upper endoscopy) procedures and any associated pathology reports.  Really just need the past 2-3 colonoscopy reports and associated pathology reports.  Continue taking your asacol as you are currently.  Please return to see Dr. Ardis Hughs in 3 months.

## 2016-10-23 NOTE — Progress Notes (Signed)
Patient ID: Tara Garner, female   DOB: Jan 26, 1956, 61 y.o.   MRN: [id number]            Reason for Appointment: Consultation for Type 2 Diabetes  Referring physician: Briscoe Deutscher   History of Present Illness:          Date of diagnosis of type 2 diabetes mellitus:   2006       Background history:   She will probably treated with metformin initially, she does not remember the details Started insulin in 2007 presumably for poor control She thinks that until a couple of years ago she was followed by an endocrinologist in [place] and then PCP in [place] However A1c would usually be around 8% Also in 2015 she was hospitalized for unknown reasons and developed encephalopathy and diabetic ketoacidosis, was in a nursing home for about a month subsequently and was continued on insulin About a year ago she lost about 70 pounds with cutting back on portions  Recent history:   INSULIN regimen is:  Lantus 22 in am  Humalog 4-6 units before meals     Non-insulin hypoglycemic drugs the patient is taking are: None  Current management, blood sugar patterns and problems identified:  She is checking his blood sugar recently on an average 3.3 times a day with most readings before meals and not as many after supper  Over the last couple of years she has not had much supervision for her diabetes management and she is adjusting insulin on her own  She has had tendency to frequent hypoglycemia, this may occur at 3 AM, 11 AM or 4 PM but usually not after supper.  Fasting readings are fairly close to normal and all below 100 recently  She will reduce her Humalog by 2 units if blood sugar is relatively low before eating and occasionally skipping the dose  She is still trying to eat small portions but recently has gained back about 10 pounds that she had lost last year  Because of issues with her mobility and knee pain she has not done much walking for exercise       Side effects from  medications have been: None  Compliance with the medical regimen: Good Hypoglycemia: She has had tendency for low sugars on and off for about 2 years  Glucose monitoring:  done 3.3 times a day         Glucometer:  Freestyle .      Blood Glucose readings by time of day and averages from meter download:  PREMEAL Breakfast Lunch Dinner Bedtime  Overall   Glucose range: 67-98       Median: 85     100    POST-MEAL PC Breakfast PC Lunch PC Dinner  Glucose range: 43-136  52-1 65   103-121   Median:      Self-care: The diet that the patient has been following is: tries to limit Portions .     Meal times are:  Breakfast is at 9-10 am  Lunch: Dinner:   Typical meal intake: Breakfast is  English muffin/bagel, juice.  Lunch Sandwich, fruit, dinner is meat and potato, snacks: Apple cheese crackers or cookies             Dietician visit, most recent: Around diagnosis time               Exercise: minimal    Weight history:  maximum weight 260, most of her weight loss was early 2017  Wt Readings from Last 3 Encounters:  10/23/16 193 lb (87.5 kg)  09/12/16 192 lb 6.4 oz (87.3 kg)    Glycemic control:   Lab Results  Component Value Date   HGBA1C 5.6 09/12/2016   HGBA1C 5.7 04/09/2016   HGBA1C 5.9 01/10/2016   Lab Results  Component Value Date   MICROALBUR <0.7 09/12/2016   LDLCALC 82 09/12/2016   CREATININE 1.31 (H) 09/12/2016   Lab Results  Component Value Date   MICRALBCREAT 0.7 09/12/2016    No results found for: FRUCTOSAMINE    Allergies as of 10/23/2016      Reactions   Avelox [moxifloxacin]    Cannot recall   Benicar [olmesartan]    Cannot recall   Ceftin [cefuroxime]    Cannot recall   Codeine    Hallucinations   Imuran [azathioprine]    Instigates cough   Indocin [indomethacin]    Cannot recall reaction   Methotrexate Derivatives    Instigates cough      Medication List       Accurate as of 10/23/16 12:41 PM. Always use your most recent med list.            albuterol 108 (90 Base) MCG/ACT inhaler Commonly known as:  PROVENTIL HFA;VENTOLIN HFA Inhale 2 puffs into the lungs every 6 (six) hours as needed for wheezing or shortness of breath.   ASACOL PO Take 2,400 mg by mouth 2 (two) times daily.   aspirin EC 81 MG tablet Take 81 mg by mouth daily.   buPROPion 300 MG 24 hr tablet Commonly known as:  WELLBUTRIN XL Take 1 tablet (300 mg total) by mouth daily.   CALCIUM 500/D PO Take 500 mg by mouth daily.   cholecalciferol 1000 units tablet Commonly known as:  VITAMIN D Take 4,000 Units by mouth daily.   fluticasone 50 MCG/ACT nasal spray Commonly known as:  FLONASE Place into both nostrils daily as needed for allergies or rhinitis.   Fluticasone-Salmeterol 250-50 MCG/DOSE Aepb Commonly known as:  ADVAIR Inhale 1 puff into the lungs 2 (two) times daily as needed.   insulin glargine 100 UNIT/ML injection Commonly known as:  LANTUS Inject 22 Units into the skin daily.   insulin lispro 100 UNIT/ML injection Commonly known as:  HUMALOG Inject into the skin 3 (three) times daily before meals. Takes 6 units in the morning, takes about 4 units at lunch, and takes about 6 units at dinner, just depends on how much eats   metoprolol tartrate 100 MG tablet Commonly known as:  LOPRESSOR Take 100 mg by mouth 2 (two) times daily.   montelukast 10 MG tablet Commonly known as:  SINGULAIR Take 10 mg by mouth at bedtime.   pantoprazole 40 MG tablet Commonly known as:  PROTONIX Take 40 mg by mouth 2 (two) times daily.   ranitidine 300 MG tablet Commonly known as:  ZANTAC Take 300 mg by mouth at bedtime.   VICTOZA 18 MG/3ML Sopn Generic drug:  liraglutide Inject 0.2 mLs (1.2 mg total) into the skin daily. Inject once daily at the same time       Allergies:  Allergies  Allergen Reactions  . Avelox [Moxifloxacin]     Cannot recall  . Benicar [Olmesartan]     Cannot recall  . Ceftin [Cefuroxime]     Cannot recall  .  Codeine     Hallucinations  . Imuran [Azathioprine]     Instigates cough  . Indocin [Indomethacin]     Cannot  recall reaction   . Methotrexate Derivatives     Instigates cough    Past Medical History:  Diagnosis Date  . Acid reflux   . Asthma   . Cough due to ACE inhibitor   . Depression, recurrent ([place]) 09/16/2016  . Diabetes mellitus without complication ([place])   . DVT (deep venous thrombosis) ([place])   . Hypertension   . Morbid obesity ([place]) 09/16/2016  . Seasonal allergic rhinitis due to pollen 09/16/2016  . Sleep apnea   . Ulcerative colitis [facility])     Past Surgical History:  Procedure Laterality Date  . ABDOMINAL HYSTERECTOMY    . BREAST BIOPSY Left    benign  . CATARACT EXTRACTION    . CESAREAN SECTION    . KNEE SURGERY Left   . SHOULDER SURGERY Right     Family History  Problem Relation Age of Onset  . Diabetes Mother   . Dementia Mother   . Stroke Mother   . Coronary artery disease Mother   . Cancer Brother   . Diverticulitis Brother   . COPD Father   . Breast cancer Cousin     Social History:  reports that she has never smoked. She has never used smokeless tobacco. She reports that she does not drink alcohol or use drugs.   Review of Systems  Constitutional: Negative for reduced appetite.  HENT: Negative for trouble swallowing.        Vertigo  Eyes: Negative for visual disturbance.  Respiratory: Negative for shortness of breath.   Cardiovascular: Negative for chest pain and leg swelling.  Gastrointestinal: Positive for diarrhea. Negative for nausea.       Has had long-standing Crohn's disease, previously treated with steroids No early satiety  Endocrine: Negative for fatigue and polydipsia.  Genitourinary: Negative for frequency.  Musculoskeletal: Negative for muscle cramps.       Has had some finger joint pains, some pain in feet  Skin: Negative for rash.  Neurological: Positive for numbness, tingling and balance difficulty.       For 2 years  has had numbness in her feet ,  Also some pain , not on any medications for this  Psychiatric/Behavioral:       She does not think she has had depression, on Wellbutrin for ?  Anxiety     Lipid history: Not on statins    Lab Results  Component Value Date   CHOL 149 09/12/2016   HDL 45.70 09/12/2016   LDLCALC 82 09/12/2016   TRIG 109.0 09/12/2016   CHOLHDL 3 09/12/2016           Hypertension: Treated with metoprolol only  Most recent eye exam was 2017 in [place]  Most recent foot exam:5/18      Physical Examination:  BP 125/65   Pulse 66   Ht 4' 11"  (1.499 m)   Wt 193 lb (87.5 kg)   SpO2 98%   BMI 38.98 kg/m   GENERAL:         Patient has generalized obesity.   HEENT:         Eye exam shows normal external appearance.  Fundus exam shows no retinopathy.  Oral exam shows normal mucosa .  NECK:   There is no lymphadenopathy Thyroid is not enlarged and no nodules felt.  Carotids are normal to palpation and no bruit heard LUNGS:         Chest is symmetrical. Lungs are clear to auscultation.Marland Kitchen   HEART:  Heart sounds:  S1 and S2 are normal. No murmur or click heard., no S3 or S4.   ABDOMEN:   There is no distention present. Liver and spleen are not palpable. No other mass or tenderness present.   NEUROLOGICAL:   Ankle jerks are absent bilaterally.    Diabetic Foot Exam - Simple   Simple Foot Form Diabetic Foot exam was performed with the following findings:  Yes 10/23/2016 12:39 PM  Visual Inspection See comments:  Yes Sensation Testing See comments:  Yes Pulse Check Posterior Tibialis and Dorsalis pulse intact bilaterally:  Yes Comments She has abnormal prominence of the bone on the plantar surface in the midfoot on the right laterally.  Has flat feet.  Some hammer toes especially left Monofilament sensation markedly decreased to nearly absent in the toes and feet            Vibration sense is Absent in distal first toes. MUSCULOSKELETAL:  There is no swelling  or deformity of the peripheral joints. 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 No skin lesions present.Marland Kitchen SKIN:       No rash or lesions of concern.        ASSESSMENT:  Diabetes type 2, BMI 39  She has been on basal bolus insulin regimen for over 10 years Her A1c appears to be consistently below 6%  See history of present illness for detailed discussion of current diabetes management, blood sugar patterns and problems identified  Despite her obesity she is taking very small doses of insulin and is still getting tendency to hypoglycemia especially postprandial She appears to be requiring much less insulin since her very significant weight loss about a year ago Although she gives a history of ketoacidosis in 2015 this appeared to be in the face of severe systemic illness and no details are available Most likely she is not insulin deficient and may respond to non-insulin hypoglycemic drugs which she has not taken for several years     Complications of diabetes: Peripheral neuropathy with sensory loss, 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  No history of retinopathy or nephropathy  Borderline renal function of unclear etiology  Hypertension, treated with metoprolol  No history of hyperlipidemia  PLAN:    Discussed with the patient the nature of GLP-1 drugs, the actions on various organ systems, how they benefit blood glucose control, as well as the benefit of weight loss and  increase satiety . Explained possible side effects especially nausea and vomiting initially; discussed safety information in package insert.  Described the injection technique and dosage titration of Victoza  starting with 0.6 mg once a day at the same time for the first week and then increasing to 1.2 mg if no symptoms of nausea.  Educational brochure on Victoza and co-pay card  given  Since she has tendency to diarrhea with her Crohn's disease will not start her on metformin as yet but consider this on her next visit  Given her a flowsheet to titrate her Lantus based on blood sugars in the morning every 3 days.  She will try to keep her fasting readings between 90-130 and adjust doses up by one unit and down by 2 units if out of range  With starting [place] she can leave off Humalog for now, Victoza may take 2-3 weeks to be effective  She will have her  short-term follow-up for assessing day-to-day management with nurse educator in 2-3 weeks and also have Review of diet  She may be a good candidate for the freestyle Libre sensor and she will discuss this with the diabetes educator, however if she is going to be tapering off her insulin she may not need as frequent monitoring  Encouraged her to start some aerobic exercise as much as tolerated  For her NEUROPATHY recommended diabetic shoes that she wants to wait on this  Hypertension followed by PCP, would consider adding an ACE inhibitor if no contraindication  Follow-up in 4 weeks  Patient Instructions  Start VICTOZA injection as shown once daily at the same time of the day.   Dial the dose to 0.6 mg on the pen for the first week.    You may inject in the stomach, thigh or arm. You may experience nausea in the first few days which usually goes away.  You will feel fullness of the stomach with starting the medication and should try to keep the portions at meals small.  After 1 week increase the dose to 1.76m daily if no nausea present.   If any questions or concerns are present call the office or the [facility] at [phone] Visit [email or web address] more useful information  lantus 20 Units and adjust as directed      Total visit time including counseling = 60 minutes   Consultation note has been sent to the referring physician  [facility]5/29/2018, 12:41  PM   Note: This office note was prepared with Dragon voice recognition system technology. Any transcriptional errors that result from this process are unintentional.

## 2016-10-25 NOTE — Telephone Encounter (Signed)
There were no notes of this type found.pwr

## 2016-10-30 ENCOUNTER — Ambulatory Visit (INDEPENDENT_AMBULATORY_CARE_PROVIDER_SITE_OTHER): Payer: BLUE CROSS/BLUE SHIELD | Admitting: Sports Medicine

## 2016-10-30 ENCOUNTER — Ambulatory Visit (INDEPENDENT_AMBULATORY_CARE_PROVIDER_SITE_OTHER): Payer: BLUE CROSS/BLUE SHIELD

## 2016-10-30 ENCOUNTER — Encounter: Payer: Self-pay | Admitting: Sports Medicine

## 2016-10-30 ENCOUNTER — Telehealth: Payer: Self-pay | Admitting: Endocrinology

## 2016-10-30 ENCOUNTER — Other Ambulatory Visit: Payer: Self-pay

## 2016-10-30 VITALS — BP 122/80 | HR 71 | Ht 59.0 in | Wt 191.0 lb

## 2016-10-30 DIAGNOSIS — M25511 Pain in right shoulder: Secondary | ICD-10-CM

## 2016-10-30 DIAGNOSIS — G8929 Other chronic pain: Secondary | ICD-10-CM | POA: Diagnosis not present

## 2016-10-30 DIAGNOSIS — M25311 Other instability, right shoulder: Secondary | ICD-10-CM

## 2016-10-30 DIAGNOSIS — M19011 Primary osteoarthritis, right shoulder: Secondary | ICD-10-CM | POA: Diagnosis not present

## 2016-10-30 NOTE — Telephone Encounter (Signed)
She is going to need a new prescription for Lantus

## 2016-10-30 NOTE — Telephone Encounter (Signed)
**  Remind patient they can make refill requests via MyChart**  Medication refill request (Name & Dosage):  insulin glargine (LANTUS) 100 UNIT/ML injection needs the pens  Preferred pharmacy (Name & Address):  [id number] [address], [place] [phone] (Phone) [phone] (Fax)     Other comments (if applicable):   Patient is almost out of the new pens and has some of her old ones, should she use the old ones or start a new rx under Dr. Dwyane Dee? Please call patient to advise.

## 2016-10-30 NOTE — Telephone Encounter (Signed)
Please advise for dosage.

## 2016-10-30 NOTE — Telephone Encounter (Signed)
20 units daily as in patient instructions

## 2016-10-30 NOTE — Patient Instructions (Addendum)
Please perform the exercise program that Tara Garner has prepared for you and gone over in detail on a daily basis.  In addition to the handout you were provided you can access your program through: [email or web address]   Your unique program code is: [email or web address]

## 2016-10-30 NOTE — Telephone Encounter (Signed)
Please advise if you want this patient to have this Rx. I see was filled by Historical Provider and do not see this mentioned in your OV recently.

## 2016-10-30 NOTE — Progress Notes (Signed)
OFFICE VISIT NOTE Juanda Bond. [place], [facility]  Laquana Villari - 61 y.o. female MRN [id number]  Date of birth: Apr 11, 1956  Visit Date: 10/30/2016  PCP: Briscoe Deutscher, DO   Referred by: Briscoe Deutscher, DO  Burlene Arnt, CMA acting as scribe for Dr. Paulla Fore.  SUBJECTIVE:   Chief Complaint  Patient presents with  . pain in right shoulder   HPI: As below and per problem based documentation when appropriate.  Pt presents today with complaint of right shoulder pain. No recent xray of the shoulder, last done about 1-2 year ago.  Pain started about 2.5 years ago.  Pt had a fall 2-3 years ago and was in a cast. She ended up in the hospital with ketoacidosis. She was living in [place] and was advised that she would need a total shoulder replacement. Her husband didn't think that her doctor there were qualified to do a surgery like that. Pt initially thought that she had a torn rotator cuff but since then she has lost muscle mass in the shoulder and is barely able to use the right shoulder.  Pain seems to occur mostly on top of the shoulder. Pt reports that she may also have some arthritis in the shoulder.   The pain is described as sharp pain and is rated as 5/10.  Worsened with raising her arm. Improves with rest. Therapies tried include : Advil, pt gets some relief with this.   Other associated symptoms include: none  Pt denies fever, chills, night sweats, unintentional weight loss or gain.     Review of Systems  Constitutional: Negative for chills and fever.  Respiratory: Negative for shortness of breath and wheezing.   Cardiovascular: Negative for chest pain, palpitations and leg swelling.  Musculoskeletal: Positive for joint pain. Negative for falls.  Neurological: Positive for tingling. Negative for dizziness and headaches.  Endo/Heme/Allergies: Does not bruise/bleed easily.    Otherwise per  HPI.  HISTORY & PERTINENT PRIOR DATA:  No specialty comments available. She reports that she has never smoked. She has never used smokeless tobacco.   Recent Labs  01/10/16 04/09/16 09/12/16 1154  HGBA1C 5.9 5.7 5.6   Medications & Allergies reviewed per EMR Patient Active Problem List   Diagnosis Date Noted  . Localized osteoarthritis of right shoulder 10/31/2016  . Diabetic peripheral neuropathy associated with type 2 diabetes mellitus ([place]) 10/23/2016  . Ulcerative colitis with complication ([place]) 40/98/1191  . Chronic right shoulder pain 09/16/2016  . Morbid obesity ([place]) 09/16/2016  . Seasonal allergic rhinitis due to pollen 09/16/2016  . Gastroesophageal reflux disease without esophagitis 09/16/2016  . Depression, recurrent ([place]) 09/16/2016  . Essential hypertension 09/16/2016  . Moderate persistent asthma without complication 47/82/9562  . Controlled type 2 diabetes mellitus with complication, with long-term current use of insulin ([place]) 09/16/2016   Past Medical History:  Diagnosis Date  . Acid reflux   . Asthma   . Cough due to ACE inhibitor   . Depression, recurrent ([place]) 09/16/2016  . Diabetes mellitus without complication ([place])   . DVT (deep venous thrombosis) ([place])   . Hypertension   . Morbid obesity ([place]) 09/16/2016  . Seasonal allergic rhinitis due to pollen 09/16/2016  . Sleep apnea   . Ulcerative colitis ([place])    Family History  Problem Relation Age of Onset  . Diabetes Mother   . Dementia Mother   . Stroke Mother   . Coronary artery disease Mother   .  Cancer Brother   . Diverticulitis Brother   . COPD Father   . Breast cancer Cousin    Past Surgical History:  Procedure Laterality Date  . ABDOMINAL HYSTERECTOMY    . BREAST BIOPSY Left    benign  . CATARACT EXTRACTION    . CESAREAN SECTION    . KNEE SURGERY Left   . SHOULDER SURGERY Right    Social History   Occupational History  . Not on file.   Social History Main Topics  . Smoking  status: Never Smoker  . Smokeless tobacco: Never Used  . Alcohol use No  . Drug use: No  . Sexual activity: Not on file    OBJECTIVE:  VS:  HT:4' 11"  (149.9 cm)   WT:191 lb (86.6 kg)  BMI:38.7    BP:122/80  HR:71bpm  TEMP: ( )  RESP:97 % EXAM: Findings:  WDWN, NAD, Non-toxic appearing Alert & appropriately interactive Not depressed or anxious appearing No increased work of breathing. Pupils are equal. EOM intact without nystagmus No clubbing or cyanosis of the extremities appreciated No significant rashes/lesions/ulcerations overlying the examined area. Radial pulses 2+/4.  No significant generalized UE edema. Sensation intact to light touch in upper extremities.  Right Shoulder Exam: Very large arms with atrophy of the right shoulder compared to the left.  She significantly guards her right shoulder and does not reach out to shake my hand. No overlying erythema/ecchymosis.   Moderate discomfort and marked course crepitation with axial loading and circumduction TTP over: Anterior shoulder and lateral deltoid No TTP over: AC joint or proximal clavicle Internal Rotation: Normal range of motion, strength is 4+/5. External Rotation: Markedly limited range of motion to only 20 with 3/5 weakness Abduction and forward flexion limited to approximately 30 and 45 respectively Empty can: Marked pain, unable to fully position due to lack of range of motion Hawkins: Unable to position Neers: Unable to position Speeds: Unable to the appropriate position but in most or flexed position the arm straight she does have marked pain along the anterior aspect of the shoulder       Dg Shoulder Right  Result Date: 10/30/2016 CLINICAL DATA:  Chronic pain EXAM: RIGHT SHOULDER - 2+ VIEW COMPARISON:  None. FINDINGS: Frontal, Y scapular, and attempted axillary images obtained. There is no acute fracture or dislocation. There is extensive generalized osteoarthritic change. Postoperative changes  noted in the humeral head region. There is superior migration of the humeral head. There are multiple calcifications lateral to the acromion and superior to the humeral head. Bones are osteoporotic. Visualized right lung clear. IMPRESSION: Extensive generalized osteoarthritic change. Superior migration of the humeral head is indicative of chronic rotator cuff tear. Areas of calcification in the lateral aspect of the joint may have posttraumatic etiology but more likely represent areas of calcific tendinosis. Postoperative changes noted in the humeral head. 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 No evidence of acute fracture or dislocation. Electronically Signed   By: Lowella Grip III M.D.   On: 10/30/2016 16:09   ASSESSMENT & PLAN:   Problem List Items Addressed This Visit    Chronic right shoulder pain - Primary   Relevant Orders   DG Shoulder Right (Completed)   Localized osteoarthritis of right shoulder    Patient has marked degenerative change on x-rays.  She is previously been told that she needs a total shoulder arthroplasty suspect given the rotator cuff insufficiency she would actually need to be a candidate for a reverse total shoulder arthroplasty.  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  She is previously had rotator cuff surgery and I suspect that she is actually re-torn this given how superior her humerus is on the glenoid.  >50% of this 30 minute visit spent in direct patient counseling and/or coordination of care.  Discussion was focused on education regarding the in discussing the pathoetiology and anticipated clinical course of the above condition.  Ultimately she is only having mild pain at this time and only has significant issues if she tries to lift.  My biggest concern is the lack of range of motion and we  discussed that if she is having worsening of this would be indication that total shoulder arthroplasty should be pursued before she becomes completely ankylosed.  We discussed that she should continue to work on maintaining the range of motion that she does have but that I think strengthening exercises would likely not be beneficial.  The appropriate range of motion exercises were reviewed with the athletic training staff including Codman's exercises.  We discussed that I am happy to inject her shoulder at any time if her pain does progressively worsen but she was told on that at this time.  She will call us to s in this chedule follow-up if interested.  I have additionally provided her Dr. Nicki Reaper Dean's for consideration of total shoulder arthroplasty information as well as Dr. Sharol Given at [facility] orthopedics to for ongoing foot issues that she has.  +++++++++++++++++++++++++++++++++++++++++++++++++++++++++++++++ PROCEDURE NOTE: THERAPEUTIC EXERCISES (97110) 15 minutes spent for Therapeutic exercises as stated in above notes.  This included exercises focusing on stretching, strengthening, with significant focus on eccentric aspects.   Proper technique shown and discussed handout in great detail with ATC.  All questions were discussed and answered.          Other Visit Diagnoses    Rotator cuff insufficiency of right shoulder          Follow-up: Return if symptoms worsen or fail to improve.   CMA/ATC served as Education administrator during this visit. History, Physical, and Plan performed by medical provider. Documentation and orders reviewed and attested to.      Teresa Coombs, [facility] Physician

## 2016-10-31 ENCOUNTER — Other Ambulatory Visit: Payer: Self-pay

## 2016-10-31 DIAGNOSIS — M19011 Primary osteoarthritis, right shoulder: Secondary | ICD-10-CM | POA: Insufficient documentation

## 2016-10-31 NOTE — Telephone Encounter (Signed)
Called patient to let her know that her Lantus has been sent to the pharmacy for her.

## 2016-10-31 NOTE — Assessment & Plan Note (Addendum)
Patient has marked degenerative change on x-rays.  She is previously been told that she needs a total shoulder arthroplasty suspect given the rotator cuff insufficiency she would actually need to be a candidate for a reverse total shoulder arthroplasty.  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  She is previously had rotator cuff surgery and I suspect that she is actually re-torn this given how superior her humerus is on the glenoid.  >50% of this 30 minute visit spent in direct patient counseling and/or coordination of care.  Discussion was focused on education regarding the in discussing the pathoetiology and anticipated clinical course of the above condition.  Ultimately she is only having mild pain at this time and only has significant issues if she tries to lift.  My biggest concern is the lack of range of motion and we discussed that if she is having worsening of this would be indication that total shoulder arthroplasty should be pursued before she becomes completely ankylosed.  We discussed that she should continue to work on maintaining the range of motion that she does have but that I think strengthening exercises would likely not be beneficial.  The appropriate range of motion exercises were reviewed with the athletic training staff including Codman's exercises.  We discussed that I am happy to inject her shoulder at any time if her pain does progressively worsen but she was told on that at this time.  She will call us to s in this chedule follow-up if interested.  I have additionally provided her Dr. Nicki Reaper Dean's for consideration of total shoulder arthroplasty information as well as Dr. Sharol Given at [facility] orthopedics to for ongoing foot issues that she has.  +++++++++++++++++++++++++++++++++++++++++++++++++++++++++++++++ PROCEDURE  NOTE: THERAPEUTIC EXERCISES (97110) 15 minutes spent for Therapeutic exercises as stated in above notes.  This included exercises focusing on stretching, strengthening, with significant focus on eccentric aspects.   Proper technique shown and discussed handout in great detail with ATC.  All questions were discussed and answered.

## 2016-11-01 ENCOUNTER — Other Ambulatory Visit: Payer: Self-pay

## 2016-11-02 ENCOUNTER — Other Ambulatory Visit: Payer: Self-pay

## 2016-11-02 MED ORDER — INSULIN GLARGINE 100 UNIT/ML ~~LOC~~ SOLN: 22.0000 [IU] | Freq: Every day | SUBCUTANEOUS | 3 refills | Status: DC

## 2016-11-02 NOTE — Telephone Encounter (Signed)
New patient script for [facility]antus not [phone] - [place], [place].  Please resend.  Thank you,  -LL

## 2016-11-02 NOTE — Telephone Encounter (Signed)
[facility].

## 2016-11-05 ENCOUNTER — Other Ambulatory Visit: Payer: Self-pay

## 2016-11-07 ENCOUNTER — Other Ambulatory Visit: Payer: Self-pay

## 2016-11-07 MED ORDER — INSULIN GLARGINE 100 UNIT/ML SOLOSTAR PEN: PEN_INJECTOR | SUBCUTANEOUS | 3 refills | Status: DC

## 2016-11-13 ENCOUNTER — Encounter: Payer: BLUE CROSS/BLUE SHIELD | Attending: Endocrinology | Admitting: Nutrition

## 2016-11-13 DIAGNOSIS — E1165 Type 2 diabetes mellitus with hyperglycemia: Secondary | ICD-10-CM | POA: Insufficient documentation

## 2016-11-13 DIAGNOSIS — Z713 Dietary counseling and surveillance: Secondary | ICD-10-CM | POA: Insufficient documentation

## 2016-11-13 DIAGNOSIS — Z794 Long term (current) use of insulin: Secondary | ICD-10-CM | POA: Diagnosis not present

## 2016-11-13 DIAGNOSIS — E1142 Type 2 diabetes mellitus with diabetic polyneuropathy: Secondary | ICD-10-CM

## 2016-11-14 NOTE — Progress Notes (Signed)
Pt. Started taking Lantus 20u qAM 2 weeks ago.  FBSs were in the 70s,and 80s, with lows during the daytime.  She has been reducing her dose, and it is now at 8u.  FBSs are 90, 104, 106, but complains of having lows in the 70s, and feeling weak and shakey during the day at different time. She is also taking Victoza 0.6.  She denies nausea, but has not increased the dose.  Typical day: 9AM up 3 ounces of juice with tea and milk, and english muffin with butter and 1 tsp. Of jelly.    3PM: Tea with milk, and 5-6 crackers 5:30-7:30- supper:  3-4 ounces protein, salad, 1 nonstarchy veg., and 2 servings of non starchy veg. Water to drink.  She has been trying to loose 50 more pounds before her son's wedding, in 5 months.    Suggestions given: 1.  Add protein to breakfast and lunch snack--low fat protein suggestions given 2.  If still having lows during the day, decrease Lantus dose by 1 more unit.   3.  Increase dose of Victoza to 1.6.   4.  Test blood sugar 2 hours after supper, and if you feel week.   5.  Exercise as tolerated.  Try to work up to 30 min. 4-5 times/wk.

## 2016-11-14 NOTE — Patient Instructions (Signed)
1.  Add protein to breakfast and lunch snack--1-2 ounces of [place], chicken, egg, low fat cheese. 2.  If still having lows during the day, decrease Lantus dose by 1 more unit.   3.  Increase dose of Victoza to 1.6.   4.  Test blood sugar 2 hours after supper, and if you feel week.   5.  Exercise as tolerated.  Try to work up to 30 min. 4-5 times/wk.

## 2016-11-15 ENCOUNTER — Telehealth: Payer: Self-pay | Admitting: Gastroenterology

## 2016-11-15 MED ORDER — MESALAMINE 800 MG PO TBEC: 2400.0000 mg | DELAYED_RELEASE_TABLET | Freq: Two times a day (BID) | ORAL | 3 refills | Status: DC

## 2016-11-15 NOTE — Telephone Encounter (Signed)
Refill sent as requested. 

## 2016-11-16 ENCOUNTER — Other Ambulatory Visit: Payer: Self-pay

## 2016-11-16 ENCOUNTER — Telehealth: Payer: Self-pay | Admitting: Family Medicine

## 2016-11-16 MED ORDER — MONTELUKAST SODIUM 10 MG PO TABS: 10.0000 mg | ORAL_TABLET | Freq: Every day | ORAL | 0 refills | Status: DC

## 2016-11-16 NOTE — Telephone Encounter (Signed)
**  Remind patient they can make refill requests via MyChart**  Medication refill request (Name & Dosage):  Singulair 10 mg once at night  Preferred pharmacy (Name & Address):  [id number] [place], [place]  Other comments (if applicable):

## 2016-11-16 NOTE — Telephone Encounter (Signed)
Rx has been sent to patient's pharmacy.

## 2016-12-04 ENCOUNTER — Other Ambulatory Visit (INDEPENDENT_AMBULATORY_CARE_PROVIDER_SITE_OTHER): Payer: BLUE CROSS/BLUE SHIELD

## 2016-12-04 DIAGNOSIS — Z794 Long term (current) use of insulin: Secondary | ICD-10-CM | POA: Diagnosis not present

## 2016-12-04 DIAGNOSIS — E1165 Type 2 diabetes mellitus with hyperglycemia: Secondary | ICD-10-CM

## 2016-12-04 LAB — BASIC METABOLIC PANEL
BUN: 21 mg/dL (ref 6–23)
CALCIUM: 9 mg/dL (ref 8.4–10.5)
CO2: 23 mEq/L (ref 19–32)
CREATININE: 1.24 mg/dL — AB (ref 0.40–1.20)
Chloride: 111 mEq/L (ref 96–112)
GFR: 46.56 mL/min — AB (ref >60.00)
Glucose, Bld: 103 mg/dL — ABNORMAL HIGH (ref 70–99)
Potassium: 4.2 mEq/L (ref 3.5–5.1)
Sodium: 142 mEq/L (ref 135–145)

## 2016-12-05 LAB — FRUCTOSAMINE: Fructosamine: 314 umol/L — ABNORMAL HIGH (ref 0–285)

## 2016-12-06 ENCOUNTER — Encounter: Payer: Self-pay | Admitting: Endocrinology

## 2016-12-06 ENCOUNTER — Ambulatory Visit (INDEPENDENT_AMBULATORY_CARE_PROVIDER_SITE_OTHER): Payer: BLUE CROSS/BLUE SHIELD | Admitting: Endocrinology

## 2016-12-06 VITALS — BP 132/88 | HR 69 | Ht 59.0 in | Wt 184.0 lb

## 2016-12-06 DIAGNOSIS — E1165 Type 2 diabetes mellitus with hyperglycemia: Secondary | ICD-10-CM | POA: Diagnosis not present

## 2016-12-06 DIAGNOSIS — Z794 Long term (current) use of insulin: Secondary | ICD-10-CM | POA: Diagnosis not present

## 2016-12-06 MED ORDER — METFORMIN HCL 500 MG PO TABS
500.0000 mg | ORAL_TABLET | Freq: Two times a day (BID) | ORAL | 3 refills | Status: DC
Start: 2016-12-06 — End: 2016-12-10

## 2016-12-06 NOTE — Patient Instructions (Addendum)
Check blood sugars on waking up 4/7  Also check blood sugars about 2 hours after a meal and do this after different meals by rotation  Recommended blood sugar levels on waking up is 90-130 and about 2 hours after meal is 130-160  Please bring your blood sugar monitor to each visit, thank you  Stop Insulin  Metformin with dinner

## 2016-12-06 NOTE — Progress Notes (Signed)
Patient ID: Tara Garner, female   DOB: 1956/05/17, 61 y.o.   MRN: [id number]            Reason for Appointment:  Follow-up for Type 2 Diabetes  Referring physician: Briscoe Deutscher   History of Present Illness:          Date of diagnosis of type 2 diabetes mellitus:   2006       Background history:   She was probably treated with metformin initially, she does not remember the details Started insulin in 2007 presumably for poor control She thinks that until a couple of years ago she was followed by an endocrinologist in [place] and then PCP in [place] However A1c would usually be around 8% Also in 2015 she was hospitalized for unknown reasons and developed encephalopathy and diabetic ketoacidosis, was in a nursing home for about a month subsequently and was continued on insulin About a year ago she lost about 70 pounds with cutting back on portions  Recent history:   INSULIN regimen is:  Lantus 4 in am      Non-insulin hypoglycemic drugs the patient is taking are: Victoza 1.2 mg daily  Her A1c tends to be below 6, last reading 5.6 Fructosamine is now mildly increased at 314   Current management, blood sugar patterns and problems identified:  On her initial consultation she was on basal bolus insulin regimen with tendency to hypoglycemia especially in early morning and after breakfast  She was started on VICTOZA and titrated this to 1.2 mg.  She has no nausea with this  She was told to progressively cut down her Lantus based on her fasting blood sugar and a flowsheet was provided  Since her sugars had been mostly below 90 she continued to reduce her insulin from her original dose of 20 down to 4 units for the last 10 days  She has also not been taking any Humalog  Although her fasting readings are excellent she has not done many readings after meals  She is still trying to eat small portions and this is easier to do with Victoza which decreases her  appetite  Because of issues with her mobility and knee pain she has not done any walking for exercise  However has lost about 9 pounds       Side effects from medications have been: None  Compliance with the medical regimen: Good Hypoglycemia: None recently  Glucose monitoring:  done 3.3 times a day         Glucometer:  Freestyle .      Blood Glucose readings by time of day and averages from meter download:  Mean values apply above for all meters except median for One Touch  PRE-MEAL Fasting Lunch Dinner Bedtime Overall  Glucose range: 87-1 24   108, 138     Mean/median: 103     105   Self-care: The diet that the patient has been following is: tries to limit Portions .     Meal times are:  Breakfast is at 9-10 am  Lunch: Dinner:   Typical meal intake: Breakfast is  English muffin/bagel, juice.  Lunch lite, fruit; dinner is meat and potato, snacks: Apple cheese crackers or cookies             Dietician visit, most recent: Around diagnosis time               Exercise: minimal    Weight history:  maximum weight 260, most of her  weight loss was early 2017   Wt Readings from Last 3 Encounters:  12/06/16 184 lb (83.5 kg)  11/14/16 186 lb 12.8 oz (84.7 kg)  10/30/16 191 lb (86.6 kg)    Glycemic control:   Lab Results  Component Value Date   HGBA1C 5.6 09/12/2016   HGBA1C 5.7 04/09/2016   HGBA1C 5.9 01/10/2016   Lab Results  Component Value Date   MICROALBUR <0.7 09/12/2016   LDLCALC 82 09/12/2016   CREATININE 1.24 (H) 12/04/2016   Lab Results  Component Value Date   MICRALBCREAT 0.7 09/12/2016    Lab Results  Component Value Date   FRUCTOSAMINE 314 (H) 12/04/2016      Allergies as of 12/06/2016      Reactions   Avelox [moxifloxacin]    Cannot recall   Benicar [olmesartan]    Cannot recall   Ceftin [cefuroxime]    Cannot recall   Codeine    Hallucinations   Imuran [azathioprine]    Instigates cough   Indocin [indomethacin]    Cannot recall reaction    Methotrexate Derivatives    Instigates cough      Medication List       Accurate as of 12/06/16 11:59 PM. Always use your most recent med list.          albuterol 108 (90 Base) MCG/ACT inhaler Commonly known as:  PROVENTIL HFA;VENTOLIN HFA Inhale 2 puffs into the lungs every 6 (six) hours as needed for wheezing or shortness of breath.   aspirin EC 81 MG tablet Take 81 mg by mouth daily.   buPROPion 300 MG 24 hr tablet Commonly known as:  WELLBUTRIN XL Take 1 tablet (300 mg total) by mouth daily.   CALCIUM 500/D PO Take 500 mg by mouth daily.   cholecalciferol 1000 units tablet Commonly known as:  VITAMIN D Take 4,000 Units by mouth daily.   fluticasone 50 MCG/ACT nasal spray Commonly known as:  FLONASE Place into both nostrils daily as needed for allergies or rhinitis.   Fluticasone-Salmeterol 250-50 MCG/DOSE Aepb Commonly known as:  ADVAIR Inhale 1 puff into the lungs 2 (two) times daily as needed.   Insulin Glargine 100 UNIT/ML Solostar Pen Commonly known as:  LANTUS SOLOSTAR Inject 22 units in the skin daily   Mesalamine 800 MG Tbec Take 3 tablets (2,400 mg total) by mouth 2 (two) times daily.   metFORMIN 500 MG tablet Commonly known as:  GLUCOPHAGE Take 1 tablet (500 mg total) by mouth 2 (two) times daily with a meal.   metoprolol tartrate 100 MG tablet Commonly known as:  LOPRESSOR Take 100 mg by mouth 2 (two) times daily.   montelukast 10 MG tablet Commonly known as:  SINGULAIR Take 1 tablet (10 mg total) by mouth at bedtime.   pantoprazole 40 MG tablet Commonly known as:  PROTONIX Take 40 mg by mouth 2 (two) times daily.   ranitidine 300 MG tablet Commonly known as:  ZANTAC Take 300 mg by mouth at bedtime.   VICTOZA 18 MG/3ML Sopn Generic drug:  liraglutide Inject 0.2 mLs (1.2 mg total) into the skin daily. Inject once daily at the same time       Allergies:  Allergies  Allergen Reactions  . Avelox [Moxifloxacin]     Cannot recall  .  Benicar [Olmesartan]     Cannot recall  . Ceftin [Cefuroxime]     Cannot recall  . Codeine     Hallucinations  . Imuran [Azathioprine]     Instigates  cough  . Indocin [Indomethacin]     Cannot recall reaction   . Methotrexate Derivatives     Instigates cough    Past Medical History:  Diagnosis Date  . Acid reflux   . Asthma   . Cough due to ACE inhibitor   . Depression, recurrent ([place]) 09/16/2016  . Diabetes mellitus without complication ([place])   . DVT (deep venous thrombosis) ([place])   . Hypertension   . Morbid obesity ([place]) 09/16/2016  . Seasonal allergic rhinitis due to pollen 09/16/2016  . Sleep apnea   . Ulcerative colitis [facility])     Past Surgical History:  Procedure Laterality Date  . ABDOMINAL HYSTERECTOMY    . BREAST BIOPSY Left    benign  . CATARACT EXTRACTION    . CESAREAN SECTION    . KNEE SURGERY Left   . SHOULDER SURGERY Right     Family History  Problem Relation Age of Onset  . Diabetes Mother   . Dementia Mother   . Stroke Mother   . Coronary artery disease Mother   . Cancer Brother   . Diverticulitis Brother   . COPD Father   . Breast cancer Cousin     Social History:  reports that she has never smoked. She has never used smokeless tobacco. She reports that she does not drink alcohol or use drugs.   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  No history of retinopathy or nephropathy  Has had some constipation  Physical Examination:  BP 132/88   Pulse 69   Ht 4' 11"  (1.499 m)   Wt 184 lb (83.5 kg)   SpO2 94%   BMI 37.16  kg/m      ASSESSMENT:  Diabetes type 2, BMI 37  See history of present illness for detailed discussion of current diabetes management, blood sugar patterns and problems identified  She has been previously on basal bolus insulin regimen for over 10 years Historically A1c has been consistently below 6% With stopping her Humalog and starting Victoza she has minimized her insulin doses and is taking only 4 units Lantus She has also lost weight and is able to cut back on portions with Victoza, does not think the decrease in appetite is excessive, no nausea  She has however not checked readings after meals much and not clear why her fructosamine is high at 314  PLAN:    She will stop her low-dose Lantus.  She will start metformin 500 mg with her dinnertime, she thinks she can tolerate this patient since she is tending to have constipation now  May consider increasing the dose if needed  She will try to continue the Victoza 1.2 mg  Does need to check readings after meals instead of just in the morning and rotate testing times  Discussed blood sugar targets  Patient Instructions  Check blood sugars on waking up 4/7  Also check blood sugars about 2 hours after a meal and do this after different meals by rotation  Recommended blood sugar  levels on waking up is 90-130 and about 2 hours after meal is 130-160  Please bring your blood sugar monitor to each visit, thank you  Stop Insulin  Metformin with dinner    Alyah Boehning 12/07/2016, 8:00 AM   Note: This office note was prepared with Estate agent. Any transcriptional errors that result from this process are unintentional.

## 2016-12-10 ENCOUNTER — Telehealth: Payer: Self-pay | Admitting: Family Medicine

## 2016-12-10 ENCOUNTER — Other Ambulatory Visit: Payer: Self-pay

## 2016-12-10 MED ORDER — METFORMIN HCL ER 500 MG PO TB24: 500.0000 mg | ORAL_TABLET | Freq: Every day | ORAL | 3 refills | Status: DC

## 2016-12-10 NOTE — Telephone Encounter (Signed)
Patient is new to metformin as of 07/12 and is having stomach pain noe.Thinks it may be side effect.  Thank you,  -LL

## 2016-12-10 NOTE — Telephone Encounter (Signed)
Change to metformin to metformin ER 500 mg with evening meal, this should cause less abdominal discomfort

## 2016-12-10 NOTE — Telephone Encounter (Signed)
Called patient and let her know that we are changing her Metformin to Metformin ER 526m to take with her evening meal. [facility].

## 2016-12-10 NOTE — Telephone Encounter (Signed)
Please advise 

## 2016-12-12 ENCOUNTER — Ambulatory Visit (INDEPENDENT_AMBULATORY_CARE_PROVIDER_SITE_OTHER): Payer: BLUE CROSS/BLUE SHIELD | Admitting: Family Medicine

## 2016-12-12 ENCOUNTER — Encounter: Payer: Self-pay | Admitting: Family Medicine

## 2016-12-12 VITALS — BP 126/80 | HR 73 | Temp 98.1°F | Ht 59.0 in | Wt 181.4 lb

## 2016-12-12 DIAGNOSIS — E118 Type 2 diabetes mellitus with unspecified complications: Secondary | ICD-10-CM | POA: Diagnosis not present

## 2016-12-12 DIAGNOSIS — Z794 Long term (current) use of insulin: Secondary | ICD-10-CM | POA: Diagnosis not present

## 2016-12-12 DIAGNOSIS — E1142 Type 2 diabetes mellitus with diabetic polyneuropathy: Secondary | ICD-10-CM | POA: Diagnosis not present

## 2016-12-12 DIAGNOSIS — N183 Chronic kidney disease, stage 3 unspecified: Secondary | ICD-10-CM

## 2016-12-12 DIAGNOSIS — D631 Anemia in chronic kidney disease: Secondary | ICD-10-CM | POA: Diagnosis not present

## 2016-12-12 DIAGNOSIS — F339 Major depressive disorder, recurrent, unspecified: Secondary | ICD-10-CM

## 2016-12-12 DIAGNOSIS — E1122 Type 2 diabetes mellitus with diabetic chronic kidney disease: Secondary | ICD-10-CM | POA: Diagnosis not present

## 2016-12-12 LAB — CBC WITH DIFFERENTIAL/PLATELET
Basophils Absolute: 0.1 10*3/uL (ref 0.0–0.1)
Basophils Relative: 1.4 % (ref 0.0–3.0)
Eosinophils Absolute: 0.5 10*3/uL (ref 0.0–0.7)
Eosinophils Relative: 5.4 % — ABNORMAL HIGH (ref 0.0–5.0)
HCT: 39.1 % (ref 36.0–46.0)
Hemoglobin: 12.6 g/dL (ref 12.0–15.0)
Lymphocytes Relative: 29.8 % (ref 12.0–46.0)
Lymphs Abs: 2.5 10*3/uL (ref 0.7–4.0)
MCHC: 32.4 g/dL (ref 30.0–36.0)
MCV: 88.9 fl (ref 78.0–100.0)
Monocytes Absolute: 0.5 10*3/uL (ref 0.1–1.0)
Monocytes Relative: 6.4 % (ref 3.0–12.0)
Neutro Abs: 4.8 10*3/uL (ref 1.4–7.7)
Neutrophils Relative %: 57 % (ref 43.0–77.0)
Platelets: 298 10*3/uL (ref 150.0–400.0)
RBC: 4.39 Mil/uL (ref 3.87–5.11)
RDW: 14.3 % (ref 11.5–15.5)
WBC: 8.5 10*3/uL (ref 4.0–10.5)

## 2016-12-12 LAB — COMPREHENSIVE METABOLIC PANEL
ALT: 22 U/L (ref 0–35)
AST: 25 U/L (ref 0–37)
Albumin: 3.8 g/dL (ref 3.5–5.2)
Alkaline Phosphatase: 80 U/L (ref 39–117)
BUN: 21 mg/dL (ref 6–23)
CO2: 20 mEq/L (ref 19–32)
Calcium: 9.1 mg/dL (ref 8.4–10.5)
Chloride: 107 mEq/L (ref 96–112)
Creatinine, Ser: 1.39 mg/dL — ABNORMAL HIGH (ref 0.40–1.20)
GFR: 40.81 mL/min — ABNORMAL LOW (ref >60.00)
Glucose, Bld: 124 mg/dL — ABNORMAL HIGH (ref 70–99)
Potassium: 4.6 mEq/L (ref 3.5–5.1)
Sodium: 137 mEq/L (ref 135–145)
Total Bilirubin: 0.4 mg/dL (ref 0.2–1.2)
Total Protein: 6.1 g/dL (ref 6.0–8.3)

## 2016-12-12 LAB — HEMOGLOBIN A1C: Hgb A1c MFr Bld: 6 % (ref 4.6–6.5)

## 2016-12-12 MED ORDER — GABAPENTIN 100 MG PO CAPS: 100.0000 mg | ORAL_CAPSULE | Freq: Every day | ORAL | 0 refills | Status: DC

## 2016-12-12 NOTE — Progress Notes (Signed)
Tara Garner is a 61 y.o. female is here for follow up.  History of Present Illness:   Water quality scientist, CMA, acting as scribe for Dr. Juleen China.  HPI: Patient states she is feeling well.  She has been to endocrinology and is no longer on insulin.  She takes Metformin ER 500 mg before dinner and was also started on Victoza.  She has been losing weight.  States the Victoza curbs her hunger and when she eats she gets full very fast.    Current symptoms: no polyuria or polydipsia, no chest pain, dyspnea or TIA's, has dysesthesias in the feet.  Taking medication compliantly without noted sided effects [x]   YES  []   NO  Episodes of hypoglycemia? []   YES  [x]   NO Maintaining a diabetic diet? [x]   YES  []   NO Trying to exercise on a regular basis? []   YES  [x]   NO  On ACE inhibitor or angiotensin II receptor blocker? [x]   YES  []   NO On Aspirin? [x]   YES  []   NO  Lab Results  Component Value Date   HGBA1C 6.0 12/12/2016    Lab Results  Component Value Date   MICROALBUR <0.7 09/12/2016    Lab Results  Component Value Date   CHOL 149 09/12/2016   HDL 45.70 09/12/2016   LDLCALC 82 09/12/2016   TRIG 109.0 09/12/2016   CHOLHDL 3 09/12/2016     Wt Readings from Last 3 Encounters:  12/12/16 181 lb 6.4 oz (82.3 kg)  12/06/16 184 lb (83.5 kg)  11/14/16 186 lb 12.8 oz (84.7 kg)   BP Readings from Last 3 Encounters:  12/12/16 126/80  12/06/16 132/88  10/30/16 122/80   Lab Results  Component Value Date   CREATININE 1.39 (H) 12/12/2016   Hyperlipidemia Is the patient taking medication? []   YES  [x]   NO Trying to exercise on a regular basis? []   YES  [x]   NO Diet Compliance: compliant most of the time. Cardiovascular ROS: no chest pain or dyspnea on exertion.   Lipids:    Component Value Date/Time   CHOL 149 09/12/2016 1159   TRIG 109.0 09/12/2016 1159   HDL 45.70 09/12/2016 1159   VLDL 21.8 09/12/2016 1159   CHOLHDL 3 09/12/2016 1159   Lab Results  Component Value Date   LDLCALC 82 09/12/2016   The 10-year ASCVD risk score Mikey Bussing DC Jr., et al., 2013) is: 9.1%   Values used to calculate the score:     Age: 49 years     Sex: Female     Is Non-Hispanic African American: No     Diabetic: Yes     Tobacco smoker: No     Systolic Blood Pressure: 270 mmHg     Is BP treated: Yes     HDL Cholesterol: 45.7 mg/dL     Total Cholesterol: 149 mg/dL  Health Maintenance Due  Topic Date Due  . Hepatitis C Screening  09-09-1955  . PNEUMOCOCCAL POLYSACCHARIDE VACCINE (1) 09/25/1956  . HIV Screening  09/25/1969  . OPHTHALMOLOGY EXAM  10/12/2016   PMHx, SurgHx, SocialHx, FamHx, Medications, and Allergies were reviewed in the Visit Navigator and updated as appropriate.   Patient Active Problem List   Diagnosis Date Noted  . Anemia in stage 3 chronic kidney disease 12/15/2016  . Localized osteoarthritis of right shoulder 10/31/2016  . Diabetic peripheral neuropathy associated with type 2 diabetes mellitus ([place]) 10/23/2016  . Ulcerative colitis with complication ([place]) 62/37/6283  . Chronic right  shoulder pain 09/16/2016  . Morbid obesity ([place]) 09/16/2016  . Seasonal allergic rhinitis due to pollen 09/16/2016  . Gastroesophageal reflux disease without esophagitis 09/16/2016  . Depression, recurrent ([place]) 09/16/2016  . Essential hypertension 09/16/2016  . Moderate persistent asthma without complication 76/19/5093  . Type 2 diabetes mellitus with stage 3 chronic kidney disease ([place]) 09/16/2016   Social History  Substance Use Topics  . Smoking status: Never Smoker  . Smokeless tobacco: Never Used  . Alcohol use No   Current Medications and Allergies:   .  albuterol (PROVENTIL HFA;VENTOLIN HFA) 108 (90 Base) MCG/ACT inhaler, Inhale 2 puffs into the lungs every 6 (six) hours as needed for wheezing or shortness of breath., Disp: , Rfl:  .  aspirin EC 81 MG tablet, Take 81 mg by mouth daily., Disp: , Rfl:  .  buPROPion (WELLBUTRIN XL) 300 MG 24 hr tablet, Take 1  tablet (300 mg total) by mouth daily., Disp: 30 tablet, Rfl: 3 .  Calcium Carbonate-Vitamin D (CALCIUM 500/D PO), Take 500 mg by mouth daily., Disp: , Rfl:  .  cholecalciferol (VITAMIN D) 1000 units tablet, Take 4,000 Units by mouth daily., Disp: , Rfl:  .  fluticasone (FLONASE) 50 MCG/ACT nasal spray, Place into both nostrils daily as needed for allergies or rhinitis., Disp: , Rfl:  .  Fluticasone-Salmeterol (ADVAIR) 250-50 MCG/DOSE AEPB, Inhale 1 puff into the lungs 2 (two) times daily as needed., Disp: , Rfl:  .  Mesalamine 800 MG TBEC, Take 3 tablets (2,400 mg total) by mouth 2 (two) times daily., Disp: 540 tablet, Rfl: 3 .  metFORMIN (GLUCOPHAGE-XR) 500 MG 24 hr tablet, Take 1 tablet (500 mg total) by mouth daily before supper., Disp: 30 tablet, Rfl: 3 .  metoprolol (LOPRESSOR) 100 MG tablet, Take 100 mg by mouth 2 (two) times daily., Disp: , Rfl:  .  montelukast (SINGULAIR) 10 MG tablet, Take 1 tablet (10 mg total) by mouth at bedtime., Disp: 30 tablet, Rfl: 0 .  pantoprazole (PROTONIX) 40 MG tablet, Take 40 mg by mouth 2 (two) times daily., Disp: , Rfl:  .  ranitidine (ZANTAC) 300 MG tablet, Take 300 mg by mouth at bedtime., Disp: , Rfl:  .  VICTOZA 18 MG/3ML SOPN, Inject 0.2 mLs (1.2 mg total) into the skin daily. Inject once daily at the same time, Disp: 2 pen, Rfl: 3  Allergies  Allergen Reactions  . Avelox [Moxifloxacin]     Cannot recall  . Benicar [Olmesartan]     Cannot recall  . Ceftin [Cefuroxime]     Cannot recall  . Codeine     Hallucinations  . Imuran [Azathioprine]     Instigates cough  . Indocin [Indomethacin]     Cannot recall reaction  . Methotrexate Derivatives     Instigates cough   Review of Systems   Pertinent items are noted in the HPI. Otherwise, ROS is negative.  Vitals:   Vitals:   12/12/16 1045  BP: 126/80  Pulse: 73  Temp: 98.1 F (36.7 C)  TempSrc: Oral  SpO2: 96%  Weight: 181 lb 6.4 oz (82.3 kg)  Height: 4' 11"  (1.499 m)     Body mass  index is 36.64 kg/m.  Physical Exam:   Physical Exam  Constitutional: She appears well-developed and well-nourished. No distress.  HENT:  Head: Normocephalic and atraumatic.  Eyes: Pupils are equal, round, and reactive to light. EOM are normal.  Neck: Normal range of motion. Neck supple.  Cardiovascular: Normal rate, regular rhythm, normal  heart sounds and intact distal pulses.   Pulmonary/Chest: Effort normal.  Abdominal: Soft.  Skin: Skin is warm.  Psychiatric: She has a normal mood and affect. Her behavior is normal.  Nursing note and vitals reviewed.   Diabetic Foot Exam - Simple   Simple Foot Form Diabetic Foot exam was performed with the following findings:  Yes 12/15/2016  9:01 PM  Visual Inspection No deformities, no ulcerations, no other skin breakdown bilaterally:  Yes Sensation Testing Intact to touch and monofilament testing bilaterally:  Yes Pulse Check Posterior Tibialis and Dorsalis pulse intact bilaterally:  Yes Comments    Results for orders placed or performed in visit on 12/12/16  CBC with Differential/Platelet  Result Value Ref Range   WBC 8.5 4.0 - 10.5 K/uL   RBC 4.39 3.87 - 5.11 Mil/uL   Hemoglobin 12.6 12.0 - 15.0 g/dL   HCT 39.1 36.0 - 46.0 %   MCV 88.9 78.0 - 100.0 fl   MCHC 32.4 30.0 - 36.0 g/dL   RDW 14.3 11.5 - 15.5 %   Platelets 298.0 150.0 - 400.0 K/uL   Neutrophils Relative % 57.0 43.0 - 77.0 %   Lymphocytes Relative 29.8 12.0 - 46.0 %   Monocytes Relative 6.4 3.0 - 12.0 %   Eosinophils Relative 5.4 (H) 0.0 - 5.0 %   Basophils Relative 1.4 0.0 - 3.0 %   Neutro Abs 4.8 1.4 - 7.7 K/uL   Lymphs Abs 2.5 0.7 - 4.0 K/uL   Monocytes Absolute 0.5 0.1 - 1.0 K/uL   Eosinophils Absolute 0.5 0.0 - 0.7 K/uL   Basophils Absolute 0.1 0.0 - 0.1 K/uL  Comprehensive metabolic panel  Result Value Ref Range   Sodium 137 135 - 145 mEq/L   Potassium 4.6 3.5 - 5.1 mEq/L   Chloride 107 96 - 112 mEq/L   CO2 20 19 - 32 mEq/L   Glucose, Bld 124 (H) 70 - 99  mg/dL   BUN 21 6 - 23 mg/dL   Creatinine, Ser 1.39 (H) 0.40 - 1.20 mg/dL   Total Bilirubin 0.4 0.2 - 1.2 mg/dL   Alkaline Phosphatase 80 39 - 117 U/L   AST 25 0 - 37 U/L   ALT 22 0 - 35 U/L   Total Protein 6.1 6.0 - 8.3 g/dL   Albumin 3.8 3.5 - 5.2 g/dL   Calcium 9.1 8.4 - 10.5 mg/dL   GFR 40.81 (L) >60.00 mL/min  Hemoglobin A1c  Result Value Ref Range   Hgb A1c MFr Bld 6.0 4.6 - 6.5 %   Assessment and Plan:   Tara Garner was seen today for follow-up.  Diagnoses and all orders for this visit:  Diabetic peripheral neuropathy associated with type 2 diabetes mellitus ([place]) Comments: After discussion, patient would like to start below medication. Expectations, risks, and potential side effects reviewed.  Orders: -     CBC with Differential/Platelet -     Comprehensive metabolic panel -     gabapentin (NEURONTIN) 100 MG capsule; Take 1 capsule (100 mg total) by mouth at bedtime.  Controlled type 2 diabetes mellitus with complication, with long-term current use of insulin ([facility]) Comments: Off insulin. Doing very well with Victoza. Continue current regimen.  Lab Results  Component Value Date   HGBA1C 6.0 12/12/2016   Orders: -     Hemoglobin A1c  Type 2 diabetes mellitus with stage 3 chronic kidney disease, unspecified whether long term insulin use ([facility]) Comments:  Lab Results  Component Value Date   CREATININE 1.39 (  H) 12/12/2016   CREATININE 1.24 (H) 12/04/2016   CREATININE 1.31 (H) 09/12/2016   Orders: -     CBC with Differential/Platelet -     Comprehensive metabolic panel -     Hemoglobin A1c  Anemia in stage 3 chronic kidney disease Comments:  Normal labs today.  Lab Results  Component Value Date   WBC 8.5 12/12/2016   HGB 12.6 12/12/2016   HCT 39.1 12/12/2016   MCV 88.9 12/12/2016   PLT 298.0 12/12/2016   Orders: -     CBC with Differential/Platelet -     Comprehensive metabolic panel -     Hemoglobin A1c  Morbid obesity ([place]) Comments: Improving,  with use of Victoza.  Depression, recurrent ([place]) Comments: No concerns today. Continue current treatment.    . Reviewed expectations re: course of current medical issues. . Discussed self-management of symptoms. . Outlined signs and symptoms indicating need for more acute intervention. . Patient verbalized understanding and all questions were answered. Marland Kitchen Health Maintenance issues including appropriate healthy diet, exercise, and smoking avoidance were discussed with patient. . See orders for this visit as documented in the electronic medical record. . Patient received an After Visit Summary.  CMA served as Education administrator during this visit. History, Physical, and Plan performed by medical provider. The above documentation has been reviewed and is accurate and complete. Briscoe Deutscher, D.O.  Briscoe Deutscher, DO [place], [place] 12/15/2016  Future Appointments Date Time Provider [place]  02/01/2017 10:30 AM [facility] None  02/06/2017 1:00 PM Elayne Snare, MD [facility] None

## 2016-12-15 DIAGNOSIS — D631 Anemia in chronic kidney disease: Secondary | ICD-10-CM | POA: Insufficient documentation

## 2016-12-15 DIAGNOSIS — N183 Chronic kidney disease, stage 3 unspecified: Secondary | ICD-10-CM | POA: Insufficient documentation

## 2016-12-17 ENCOUNTER — Other Ambulatory Visit: Payer: Self-pay

## 2016-12-17 ENCOUNTER — Other Ambulatory Visit: Payer: Self-pay | Admitting: Family Medicine

## 2016-12-17 DIAGNOSIS — N189 Chronic kidney disease, unspecified: Secondary | ICD-10-CM

## 2016-12-18 MED ORDER — MONTELUKAST SODIUM 10 MG PO TABS: 10.0000 mg | ORAL_TABLET | Freq: Every day | ORAL | 6 refills | Status: DC

## 2017-01-16 ENCOUNTER — Other Ambulatory Visit: Payer: Self-pay

## 2017-01-16 ENCOUNTER — Telehealth: Payer: Self-pay | Admitting: Family Medicine

## 2017-01-16 MED ORDER — METOPROLOL SUCCINATE ER 100 MG PO TB24: 100.0000 mg | ORAL_TABLET | Freq: Every day | ORAL | 1 refills | Status: DC

## 2017-01-16 MED ORDER — METOPROLOL TARTRATE 100 MG PO TABS: 100.0000 mg | ORAL_TABLET | Freq: Two times a day (BID) | ORAL | 1 refills | Status: DC

## 2017-01-16 NOTE — Telephone Encounter (Signed)
Spoke with pharmacy.  Patient normally takes Toprol XL 100 mg once daily.  Transmitted new Rx for Toprol XL 100 mg #90 with 1 refill to pharmacy.

## 2017-01-16 NOTE — Telephone Encounter (Signed)
[name] from Pharmacy need a call for clarification on the metoprolol tartrate (LOPRESSOR) 100 MG tablet. Pharmacy states that "the medication is not what the patient gets and they would normally get the Toprolol XL and he needs to discuss the directions." Call to advise in order for rx to be filled.

## 2017-01-16 NOTE — Telephone Encounter (Signed)
MEDICATION: metoprolol (LOPRESSOR) 100 MG tablet  PH[facility]  [id number] [place] [facility] [address] [phone](Phone) [phone](Fax)       IS THIS A 90 DAY SUPPLY : Y (patient usually gets 30 day supply but would like 90)  IS PATIENT OUT OF MEDICTAION: N  IF NOT; HOW MUCH IS LEFT: about 13 days according to patient  LAST APPOINTMENT DATE: 12/12/16  NEXT APPOINTMENT DATE: N/A  OTHER COMMENTS: Patient stated this is the first time Dr. WJuleen Chinawould be filling this Rx.    **Let patient know to contact pharmacy at the end of the day to make sure medication is ready. **  ** Please notify patient to allow 48-72 hours to process**  **Encourage patient to contact the pharmacy for refills or [facility]

## 2017-01-16 NOTE — Telephone Encounter (Signed)
RX sent to pharmacy for 90 day supply.

## 2017-01-16 NOTE — Progress Notes (Signed)
t

## 2017-01-19 ENCOUNTER — Other Ambulatory Visit: Payer: Self-pay | Admitting: Family Medicine

## 2017-01-29 ENCOUNTER — Other Ambulatory Visit: Payer: Self-pay | Admitting: Gastroenterology

## 2017-01-29 MED ORDER — PANTOPRAZOLE SODIUM 40 MG PO TBEC: 40.0000 mg | DELAYED_RELEASE_TABLET | Freq: Two times a day (BID) | ORAL | 1 refills | Status: DC

## 2017-01-29 NOTE — Telephone Encounter (Signed)
Prescription sent pt to keep appt for further refills

## 2017-02-01 ENCOUNTER — Other Ambulatory Visit (INDEPENDENT_AMBULATORY_CARE_PROVIDER_SITE_OTHER): Payer: BLUE CROSS/BLUE SHIELD

## 2017-02-01 DIAGNOSIS — Z794 Long term (current) use of insulin: Secondary | ICD-10-CM | POA: Diagnosis not present

## 2017-02-01 DIAGNOSIS — E1165 Type 2 diabetes mellitus with hyperglycemia: Secondary | ICD-10-CM | POA: Diagnosis not present

## 2017-02-01 LAB — COMPREHENSIVE METABOLIC PANEL
ALT: 21 U/L (ref 0–35)
AST: 25 U/L (ref 0–37)
Albumin: 3.9 g/dL (ref 3.5–5.2)
Alkaline Phosphatase: 78 U/L (ref 39–117)
BILIRUBIN TOTAL: 0.4 mg/dL (ref 0.2–1.2)
BUN: 22 mg/dL (ref 6–23)
CHLORIDE: 107 meq/L (ref 96–112)
CO2: 24 meq/L (ref 19–32)
Calcium: 9.3 mg/dL (ref 8.4–10.5)
Creatinine, Ser: 1.44 mg/dL — ABNORMAL HIGH (ref 0.40–1.20)
GFR: 39.16 mL/min — AB (ref >60.00)
GLUCOSE: 106 mg/dL — AB (ref 70–99)
POTASSIUM: 4.5 meq/L (ref 3.5–5.1)
Sodium: 139 mEq/L (ref 135–145)
Total Protein: 6.5 g/dL (ref 6.0–8.3)

## 2017-02-01 LAB — HEMOGLOBIN A1C: HEMOGLOBIN A1C: 5.9 % (ref 4.6–6.5)

## 2017-02-04 ENCOUNTER — Other Ambulatory Visit: Payer: Self-pay | Admitting: Endocrinology

## 2017-02-06 ENCOUNTER — Encounter: Payer: Self-pay | Admitting: Endocrinology

## 2017-02-06 ENCOUNTER — Ambulatory Visit (INDEPENDENT_AMBULATORY_CARE_PROVIDER_SITE_OTHER): Payer: BLUE CROSS/BLUE SHIELD | Admitting: Endocrinology

## 2017-02-06 VITALS — BP 130/86 | HR 77 | Ht 59.0 in | Wt 174.2 lb

## 2017-02-06 DIAGNOSIS — E1169 Type 2 diabetes mellitus with other specified complication: Secondary | ICD-10-CM | POA: Diagnosis not present

## 2017-02-06 DIAGNOSIS — E669 Obesity, unspecified: Secondary | ICD-10-CM | POA: Diagnosis not present

## 2017-02-06 NOTE — Progress Notes (Signed)
Patient ID: Tara Garner, female   DOB: August 27, 1955, 61 y.o.   MRN: [id number]            Reason for Appointment:  Follow-up for Type 2 Diabetes  Referring physician: Briscoe Deutscher   History of Present Illness:          Date of diagnosis of type 2 diabetes mellitus:   2006       Background history:   She was probably treated with metformin initially, she does not remember the details Started insulin in 2007 presumably for poor control She thinks that until a couple of years ago she was followed by an endocrinologist in [place] and then PCP in [place] However A1c would usually be around 8% Also in 2015 she was hospitalized for unknown reasons and developed encephalopathy and diabetic ketoacidosis, was in a nursing home for about a month subsequently and was continued on insulin About a year ago she lost about 70 pounds with cutting back on portions   On her initial consultation she was on basal bolus insulin regimen with tendency to hypoglycemia especially in early morning and after breakfast  Recent history:   INSULIN regimen is:   none  Non-insulin hypoglycemic drugs the patient is taking are: Victoza 1.2 mg daily  Her A1c tends to be below 6, now 5.9  Current management, blood sugar patterns and problems identified:  She has continued on VICTOZA and on 1.2 mg.  She has no nausea with this, however she thinks that she does not get hungry  Since she was only taking 4 units of insulin on her last visit she was told to stop this  She was also told to try metformin ER on her last visit but she thinks she had some nausea with even the 500 mg dose and stopped taking on her own  Her weight is down further  Fasting readings are fairly consistently near normal  However she has not done any readings after meals as required and instructed  She was told to do more readings after meals because of relatively high fructosamine on the last visit  Although she is not  able to do much physical activity she is trying to do a little more walking, does not like to go outdoors in the heat  She thinks her portions are small but not always getting protein at breakfast       Side effects from medications have been: None  Compliance with the medical regimen: Good Hypoglycemia: None recently  Glucose monitoring:  done <1 times a day         Glucometer:  Freestyle .      Blood Glucose readings by time of day and averages from meter download:  Mean values apply above for all meters except median for One Touch  PRE-MEAL Fasting Lunch Dinner Bedtime Overall  Glucose range: 95-1 07       Mean/median:     102     Self-care: The diet that the patient has been following is: tries to limit Portions .     Meal times are:  Breakfast is at 9-10 am  Lunch: Dinner:   Typical meal intake: Breakfast is  English muffin/bagel, juice.  Lunch lite, fruit; dinner is meat and potato, snacks: Apple cheese crackers or cookies             Dietician visit, most recent: Around diagnosis time CDE visit: 6/18  Exercise: a little walking  Weight history:  maximum weight 260, most of her weight loss was in early 2017   Wt Readings from Last 3 Encounters:  02/06/17 174 lb 3.2 oz (79 kg)  12/12/16 181 lb 6.4 oz (82.3 kg)  12/06/16 184 lb (83.5 kg)    Glycemic control:   Lab Results  Component Value Date   HGBA1C 5.9 02/01/2017   HGBA1C 6.0 12/12/2016   HGBA1C 5.6 09/12/2016   Lab Results  Component Value Date   MICROALBUR <0.7 09/12/2016   LDLCALC 82 09/12/2016   CREATININE 1.44 (H) 02/01/2017   Lab Results  Component Value Date   MICRALBCREAT 0.7 09/12/2016    Lab Results  Component Value Date   FRUCTOSAMINE 314 (H) 12/04/2016      Allergies as of 02/06/2017      Reactions   Avelox [moxifloxacin]    Cannot recall   Benicar [olmesartan]    Cannot recall   Ceftin [cefuroxime]    Cannot recall   Codeine    Hallucinations   Imuran  [azathioprine]    Instigates cough   Indocin [indomethacin]    Cannot recall reaction   Methotrexate Derivatives    Instigates cough      Medication List       Accurate as of 02/06/17  8:55 PM. Always use your most recent med list.          albuterol 108 (90 Base) MCG/ACT inhaler Commonly known as:  PROVENTIL HFA;VENTOLIN HFA Inhale 2 puffs into the lungs every 6 (six) hours as needed for wheezing or shortness of breath.   aspirin EC 81 MG tablet Take 81 mg by mouth daily.   buPROPion 300 MG 24 hr tablet Commonly known as:  WELLBUTRIN XL TAKE 1 TABLET BY MOUTH EVERY DAY   CALCIUM 500/D PO Take 500 mg by mouth daily.   cholecalciferol 1000 units tablet Commonly known as:  VITAMIN D Take 4,000 Units by mouth daily.   fluticasone 50 MCG/ACT nasal spray Commonly known as:  FLONASE Place into both nostrils daily as needed for allergies or rhinitis.   Fluticasone-Salmeterol 250-50 MCG/DOSE Aepb Commonly known as:  ADVAIR Inhale 1 puff into the lungs 2 (two) times daily as needed.   Mesalamine 800 MG Tbec Take 3 tablets (2,400 mg total) by mouth 2 (two) times daily.   metoprolol succinate 100 MG 24 hr tablet Commonly known as:  TOPROL XL Take 1 tablet (100 mg total) by mouth daily. Take with or immediately following a meal.   montelukast 10 MG tablet Commonly known as:  SINGULAIR Take 1 tablet (10 mg total) by mouth at bedtime.   pantoprazole 40 MG tablet Commonly known as:  PROTONIX Take 1 tablet (40 mg total) by mouth 2 (two) times daily.   ranitidine 300 MG tablet Commonly known as:  ZANTAC Take 300 mg by mouth at bedtime.   VICTOZA 18 MG/3ML Sopn Generic drug:  liraglutide INJECT 0.2 MLS (1.2 MG TOTAL) INTO THE SKIN DAILY. INJECT ONCE DAILY AT THE SAME TIME       Allergies:  Allergies  Allergen Reactions  . Avelox [Moxifloxacin]     Cannot recall  . Benicar [Olmesartan]     Cannot recall  . Ceftin [Cefuroxime]     Cannot recall  . Codeine      Hallucinations  . Imuran [Azathioprine]     Instigates cough  . Indocin [Indomethacin]     Cannot recall reaction  . Methotrexate Derivatives  Instigates cough    Past Medical History:  Diagnosis Date  . Acid reflux   . Asthma   . Cough due to ACE inhibitor   . Depression, recurrent ([place]) 09/16/2016  . Diabetes mellitus without complication ([place])   . DVT (deep venous thrombosis) ([place])   . Hypertension   . Morbid obesity ([place]) 09/16/2016  . Seasonal allergic rhinitis due to pollen 09/16/2016  . Sleep apnea   . Ulcerative colitis [facility])     Past Surgical History:  Procedure Laterality Date  . ABDOMINAL HYSTERECTOMY    . BREAST BIOPSY Left    benign  . CATARACT EXTRACTION    . CESAREAN SECTION    . KNEE SURGERY Left   . SHOULDER SURGERY Right     Family History  Problem Relation Age of Onset  . Diabetes Mother   . Dementia Mother   . Stroke Mother   . Coronary artery disease Mother   . Cancer Brother   . Diverticulitis Brother   . COPD Father   . Breast cancer Cousin     Social History:  reports that she has never smoked. She has never used smokeless tobacco. She reports that she does not drink alcohol or use drugs.   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  No history of retinopathy or nephropathy   Physical Examination:  BP 130/86   Pulse 77   Ht 4' 11"  (1.499 m)   Wt 174 lb 3.2 oz (79 kg)   SpO2 98%   BMI 35.18 kg/m      ASSESSMENT:  Diabetes type 2, BMI 37  See history of present illness for detailed discussion of current  diabetes management, blood sugar patterns and problems identified  She has been Off insulin and only on Victoza 1.2 mg now She could not tolerate metformin A1c is quite normal Blood sugars at home are fairly good fasting but she does not have postprandial reading She is trying to be a little more active but can do better with exercise  OBESITY: She continues to lose weight and probably more recently with Victoza instead of insulin  HYPERTENSION: She will continue to follow-up with PCP     PLAN:    She will try 0.6 mg +5 clicks on Victoza to avoid significant reduction in her appetite, Showed her on the sample pen how to do this  She needs to start checking blood sugars after meals alternating with fasting readings especially after dinner  Encouraged her to increase physical activity also  Call if blood sugars are consistently higher   Patient Instructions  Take 0.6 + 5 clicks  Check blood sugars on waking up  3/7  Also check blood sugars about 2 hours after a meal and do this after different meals by rotation  Recommended blood sugar levels on waking up is 90-130 and about 2 hours after meal is 130-160  Please bring your blood sugar monitor to each visit, thank you     [facility] 02/06/2017, 8:55 PM  Note: This office note was prepared with Estate agent. Any transcriptional errors that result from this process are unintentional.

## 2017-02-06 NOTE — Patient Instructions (Signed)
Take 0.6 + 5 clicks  Check blood sugars on waking up  3/7  Also check blood sugars about 2 hours after a meal and do this after different meals by rotation  Recommended blood sugar levels on waking up is 90-130 and about 2 hours after meal is 130-160  Please bring your blood sugar monitor to each visit, thank you

## 2017-02-07 ENCOUNTER — Other Ambulatory Visit: Payer: Self-pay

## 2017-02-07 MED ORDER — VICTOZA 18 MG/3ML ~~LOC~~ SOPN: 1.2000 mg | PEN_INJECTOR | Freq: Every day | SUBCUTANEOUS | 1 refills | Status: DC

## 2017-02-18 ENCOUNTER — Other Ambulatory Visit: Payer: Self-pay

## 2017-02-18 MED ORDER — VICTOZA 18 MG/3ML ~~LOC~~ SOPN: 1.2000 mg | PEN_INJECTOR | Freq: Every day | SUBCUTANEOUS | 1 refills | Status: DC

## 2017-02-24 ENCOUNTER — Other Ambulatory Visit: Payer: Self-pay | Admitting: Gastroenterology

## 2017-03-07 ENCOUNTER — Encounter: Payer: Self-pay | Admitting: Family Medicine

## 2017-03-07 ENCOUNTER — Ambulatory Visit (INDEPENDENT_AMBULATORY_CARE_PROVIDER_SITE_OTHER): Payer: BLUE CROSS/BLUE SHIELD | Admitting: Family Medicine

## 2017-03-07 VITALS — BP 128/72 | HR 78 | Temp 98.2°F | Ht 59.0 in | Wt 172.2 lb

## 2017-03-07 DIAGNOSIS — R300 Dysuria: Secondary | ICD-10-CM

## 2017-03-07 LAB — POC URINALSYSI DIPSTICK (AUTOMATED)
Bilirubin, UA: NEGATIVE
Blood, UA: 200
Glucose, UA: NEGATIVE
Ketones, UA: NEGATIVE
Nitrite, UA: NEGATIVE
Protein, UA: 15
Spec Grav, UA: >=1.03 — AB (ref 1.010–1.025)
Urobilinogen, UA: 0.2 E.U./dL
pH, UA: 6 (ref 5.0–8.0)

## 2017-03-07 MED ORDER — CIPROFLOXACIN HCL 250 MG PO TABS: 250.0000 mg | ORAL_TABLET | Freq: Two times a day (BID) | ORAL | 0 refills | Status: DC

## 2017-03-07 NOTE — Progress Notes (Signed)
Tara Garner is a 61 y.o. female here for an acute visit.  History of Present Illness:   Dysuria   This is a new problem. The current episode started in the past 7 days. The problem occurs intermittently. The pain is mild. There has been no fever. She is not sexually active. There is no history of pyelonephritis. Associated symptoms include urgency. Pertinent negatives include no chills, discharge, flank pain, frequency, hematuria, hesitancy, nausea or vomiting.   PMHx, SurgHx, SocialHx, Medications, and Allergies were reviewed in the Visit Navigator and updated as appropriate.  Current Medications:   .  albuterol (PROVENTIL HFA;VENTOLIN HFA) 108 (90 Base) MCG/ACT inhaler, Inhale 2 puffs into the lungs every 6 (six) hours as needed for wheezing or shortness of breath., Disp: , Rfl:  .  aspirin EC 81 MG tablet, Take 81 mg by mouth daily., Disp: , Rfl:  .  buPROPion (WELLBUTRIN XL) 300 MG 24 hr tablet, TAKE 1 TABLET BY MOUTH EVERY DAY, Disp: 30 tablet, Rfl: 3 .  Calcium Carbonate-Vitamin D (CALCIUM 500/D PO), Take 500 mg by mouth daily., Disp: , Rfl:  .  cholecalciferol (VITAMIN D) 1000 units tablet, Take 4,000 Units by mouth daily., Disp: , Rfl:  .  fluticasone (FLONASE) 50 MCG/ACT nasal spray, Place into both nostrils daily as needed for allergies or rhinitis., Disp: , Rfl:  .  Fluticasone-Salmeterol (ADVAIR) 250-50 MCG/DOSE AEPB, Inhale 1 puff into the lungs 2 (two) times daily as needed., Disp: , Rfl:  .  metoprolol succinate (TOPROL XL) 100 MG 24 hr tablet, Take 1 tablet (100 mg total) by mouth daily. Take with or immediately following a meal., Disp: 90 tablet, Rfl: 1 .  montelukast (SINGULAIR) 10 MG tablet, Take 1 tablet (10 mg total) by mouth at bedtime., Disp: 30 tablet, Rfl: 6 .  pantoprazole (PROTONIX) 40 MG tablet, TAKE 1 TABLET BY MOUTH TWICE A DAY, Disp: 60 tablet, Rfl: 1 .  ranitidine (ZANTAC) 300 MG tablet, Take 300 mg by mouth at bedtime., Disp: , Rfl:  .  VICTOZA 18 MG/3ML  SOPN, Inject 0.2 mLs (1.2 mg total) into the skin daily. Inject once daily at the same time, Disp: 9 pen, Rfl: 1 .  Mesalamine 800 MG TBEC, Take 3 tablets (2,400 mg total) by mouth 2 (two) times daily., Disp: 540 tablet, Rfl: 3   Allergies  Allergen Reactions  . Avelox [Moxifloxacin]     Cannot recall  . Benicar [Olmesartan]     Cannot recall  . Ceftin [Cefuroxime]     Cannot recall  . Codeine     Hallucinations  . Imuran [Azathioprine]     Instigates cough  . Indocin [Indomethacin]     Cannot recall reaction  . Methotrexate Derivatives     Instigates cough   Review of Systems:   Pertinent items are noted in the HPI. Otherwise, ROS is negative.  Vitals:   Vitals:   03/07/17 0955  BP: 128/72  Pulse: 78  Temp: 98.2 F (36.8 C)  TempSrc: Oral  SpO2: 98%  Weight: 172 lb 3.2 oz (78.1 kg)  Height: 4' 11"  (1.499 m)     Body mass index is 34.78 kg/m. Physical Exam:   Physical Exam  Constitutional: She appears well-nourished.  HENT:  Head: Normocephalic and atraumatic.  Eyes: Pupils are equal, round, and reactive to light. EOM are normal.  Neck: Normal range of motion. Neck supple.  Cardiovascular: Normal rate, regular rhythm, normal heart sounds and intact distal pulses.   Pulmonary/Chest: Effort  normal.  Abdominal: Soft.  Skin: Skin is warm.  Psychiatric: She has a normal mood and affect. Her behavior is normal.  Nursing note and vitals reviewed.   Results for orders placed or performed in visit on 03/07/17  POCT Urinalysis Dipstick (Automated)  Result Value Ref Range   Color, UA Yellow    Clarity, UA Cloudy    Glucose, UA Negative    Bilirubin, UA Negative    Ketones, UA Negative    Spec Grav, UA >=1.030 (A) 1.010 - 1.025   Blood, UA 200 Ery/uL    pH, UA 6.0 5.0 - 8.0   Protein, UA 15 mg/dL    Urobilinogen, UA 0.2 0.2 or 1.0 E.U./dL   Nitrite, UA Negative    Leukocytes, UA Large (3+) (A) Negative   Assessment and Plan:   Vickki was seen today for  urinary frequency.  Diagnoses and all orders for this visit:  Dysuria -     Urine Culture -     POCT Urinalysis Dipstick (Automated) -     ciprofloxacin (CIPRO) 250 MG tablet; Take 1 tablet (250 mg total) by mouth 2 (two) times daily.   . Reviewed expectations re: course of current medical issues. . Discussed self-management of symptoms. . Outlined signs and symptoms indicating need for more acute intervention. . Patient verbalized understanding and all questions were answered. Marland Kitchen Health Maintenance issues including appropriate healthy diet, exercise, and smoking avoidance were discussed with patient. . See orders for this visit as documented in the electronic medical record. . Patient received an After Visit Summary.  Briscoe Deutscher, DO [place] 03/07/2017  Future Appointments Date Time Provider [place]  04/02/2017 11:00 AM Milus Banister, MD [facility]  05/08/2017 10:30 AM [facility] None  05/13/2017 1:15 PM Elayne Snare, MD [facility] None

## 2017-03-10 LAB — URINE CULTURE
MICRO NUMBER:: 81140318
SPECIMEN QUALITY:: ADEQUATE

## 2017-03-26 ENCOUNTER — Telehealth: Payer: Self-pay | Admitting: Gastroenterology

## 2017-03-26 MED ORDER — RANITIDINE HCL 300 MG PO TABS
300.0000 mg | ORAL_TABLET | Freq: Every day | ORAL | 0 refills | Status: DC
Start: 2017-03-26 — End: 2017-04-25

## 2017-03-26 NOTE — Telephone Encounter (Signed)
Medication sent in. 

## 2017-04-02 ENCOUNTER — Encounter: Payer: Self-pay | Admitting: Gastroenterology

## 2017-04-02 ENCOUNTER — Ambulatory Visit (INDEPENDENT_AMBULATORY_CARE_PROVIDER_SITE_OTHER): Payer: BLUE CROSS/BLUE SHIELD | Admitting: Gastroenterology

## 2017-04-02 VITALS — BP 124/80 | HR 76 | Ht 59.0 in | Wt 173.4 lb

## 2017-04-02 DIAGNOSIS — K5190 Ulcerative colitis, unspecified, without complications: Secondary | ICD-10-CM

## 2017-04-02 MED ORDER — RANITIDINE HCL 150 MG PO TABS: 300.0000 mg | ORAL_TABLET | Freq: Every day | ORAL | 3 refills | Status: DC

## 2017-04-02 NOTE — Patient Instructions (Addendum)
Cut back to once daily protonix (continue your AM pill, stop the PM pill).  Continue ranitidine 379m at bedtime nightly (new refills called in, 3 months, 3 refills).  Stay on asacol 2.4 grams twice daily.  You will be set up for a colonoscopy for high risk colon screening (chronic UC), prefers after holidays. Please return to see Dr. JArdis Hughsin 6 months. We will call you to set up appointment.  We will get records sent from your previous gastroenterologist ([facility] Dr. BKandee Keen  This will include any endoscopic (colonoscopy or upper endoscopy) procedures and any associated pathology reports.    Normal BMI (Body Mass Index- based on height and weight) is between 19 and 25. Your BMI today is Body mass index is 35.02 kg/m. .Marland KitchenPlease consider follow up  regarding your BMI with your Primary Care Provider.

## 2017-04-02 NOTE — Progress Notes (Signed)
Review of pertinent gastrointestinal problems: 1. Long-standing ulcerative colitis. She thinks it's mostly been left sided disease.Diagnosed when she was in her early 83s. Cared for in [place] for almost 40 years. On prednisone for at least 38 of those years. Had side effects from azathioprine from what she recalls. Established care Dr. Ardis Hughs May 2018 while on Asacol  2.4grams twice daily, clinically doing well.  I asked for records from her previous gastroenterologist in [place] but never received them. She believes she had a colonoscopy in 2016 while she briefly lived in [place].    HPI: This is a Very pleasant 61 year old woman whom I last saw about 6 months ago  Chief complaint is Ulcerative colitis  Since her last visit her bowels have really been fine for the most part. No overt GI bleeding. No significant abdominal pains. She usually has one solid bowel movement a day. She did have what sounds like infectious enteritis about a month ago when she had severe watery diarrhea for about 24 hours.  Takes protonix Twice daily and ranitidine at bedtime nightly. She has been taking this for years. She knows if she skips her ranitidine she can get severe overnight heartburn. She has never tried to wean back on her proton X  Started victoza recently.    Occasionally constipated.  About a month ago, she had a severe bout of diarrhea over 1-2 days, then back to her usual.  Off insulin anymore.  ROS: complete GI ROS as described in HPI, all other review negative.  Down 19 pounds in 6 months with a lot of intention.  Constitutional:  No unintentional weight loss   Past Medical History:  Diagnosis Date  . Acid reflux   . Asthma   . Cough due to ACE inhibitor   . Depression, recurrent ([place]) 09/16/2016  . Diabetes mellitus without complication ([place])   . DVT (deep venous thrombosis) ([place])   . Hypertension   . Morbid obesity ([place]) 09/16/2016  . Seasonal allergic rhinitis due  to pollen 09/16/2016  . Sleep apnea   . Ulcerative colitis [facility])     Past Surgical History:  Procedure Laterality Date  . ABDOMINAL HYSTERECTOMY    . BREAST BIOPSY Left    benign  . CATARACT EXTRACTION    . CESAREAN SECTION    . KNEE SURGERY Left   . SHOULDER SURGERY Right     Current Outpatient Medications  Medication Sig Dispense Refill  . albuterol (PROVENTIL HFA;VENTOLIN HFA) 108 (90 Base) MCG/ACT inhaler Inhale 2 puffs into the lungs every 6 (six) hours as needed for wheezing or shortness of breath.    Marland Kitchen aspirin EC 81 MG tablet Take 81 mg by mouth daily.    Marland Kitchen buPROPion (WELLBUTRIN XL) 300 MG 24 hr tablet TAKE 1 TABLET BY MOUTH EVERY DAY 30 tablet 3  . Calcium Carbonate-Vitamin D (CALCIUM 500/D PO) Take 500 mg by mouth daily.    . cholecalciferol (VITAMIN D) 1000 units tablet Take 4,000 Units by mouth daily.    . fluticasone (FLONASE) 50 MCG/ACT nasal spray Place into both nostrils daily as needed for allergies or rhinitis.    . Fluticasone-Salmeterol (ADVAIR) 250-50 MCG/DOSE AEPB Inhale 1 puff into the lungs 2 (two) times daily as needed.    . loperamide (ANTI-DIARRHEAL) 2 MG tablet Take 2 mg as needed by mouth for diarrhea or loose stools.    . Mesalamine 800 MG TBEC Take 3 tablets (2,400 mg total) by mouth 2 (two) times daily. 540 tablet  3  . metoprolol succinate (TOPROL XL) 100 MG 24 hr tablet Take 1 tablet (100 mg total) by mouth daily. Take with or immediately following a meal. 90 tablet 1  . montelukast (SINGULAIR) 10 MG tablet Take 1 tablet (10 mg total) by mouth at bedtime. 30 tablet 6  . Multiple Vitamin (MULTIVITAMIN) tablet Take 1 tablet daily by mouth.    . pantoprazole (PROTONIX) 40 MG tablet TAKE 1 TABLET BY MOUTH TWICE A DAY 60 tablet 1  . ranitidine (ZANTAC) 300 MG tablet Take 1 tablet (300 mg total) by mouth at bedtime. 30 tablet 0  . VICTOZA 18 MG/3ML SOPN Inject 0.2 mLs (1.2 mg total) into the skin daily. Inject once daily at the same time 9 pen 1   No current  facility-administered medications for this visit.     Allergies as of 04/02/2017 - Review Complete 04/02/2017  Allergen Reaction Noted  . Avelox [moxifloxacin]  09/11/2016  . Benicar [olmesartan]  09/11/2016  . Ceftin [cefuroxime]  09/11/2016  . Codeine  09/11/2016  . Imuran [azathioprine]  09/11/2016  . Indocin [indomethacin]  09/11/2016  . Methotrexate derivatives  09/11/2016    Family History  Problem Relation Age of Onset  . Diabetes Mother   . Dementia Mother   . Stroke Mother   . Coronary artery disease Mother   . Cancer Brother   . Diverticulitis Brother   . COPD Father   . Breast cancer Cousin     Social History   Socioeconomic History  . Marital status: Married    Spouse name: Not on file  . Number of children: Not on file  . Years of education: Not on file  . Highest education level: Not on file  Social Needs  . Financial resource strain: Not on file  . Food insecurity - worry: Not on file  . Food insecurity - inability: Not on file  . Transportation needs - medical: Not on file  . Transportation needs - non-medical: Not on file  Occupational History  . Not on file  Tobacco Use  . Smoking status: Never Smoker  . Smokeless tobacco: Never Used  Substance and Sexual Activity  . Alcohol use: No  . Drug use: No  . Sexual activity: Not on file  Other Topics Concern  . Not on file  Social History Narrative  . Not on file     Physical Exam: BP 124/80 (BP Location: Left Arm, Patient Position: Sitting, Cuff Size: Large)   Pulse 76   Ht 4' 11"  (1.499 m) Comment: height measured without shoes  Wt 173 lb 6 oz (78.6 kg)   BMI 35.02 kg/m  Constitutional: generally well-appearing Psychiatric: alert and oriented x3 Abdomen: soft, nontender, nondistended, no obvious ascites, no peritoneal signs, normal bowel sounds No peripheral edema noted in lower extremities  Assessment and plan: 61 y.o. female with Ulcerative colitis, longstanding  She has had  chronic ulcerative colitis for nearly 40 years. We will try again to get records from her previous [place] gastroenterologist here for review. It is been 2 years since her last colonoscopy and I recommended we repeat that for her now given her elevated risk for colon cancer She will continue on mesalamine 2.4 g twice daily. She try to come off of twice daily protonix, down to once daily. She will continue ranitidine 300 mg at bedtime. She will return to see me in 6 months In the office and sooner if any problems  Please see the "Patient Instructions" section for  addition details about the plan.  Owens Loffler, MD [facility] 04/02/2017, 11:18 AM

## 2017-04-08 ENCOUNTER — Other Ambulatory Visit: Payer: Self-pay

## 2017-04-08 MED ORDER — BUPROPION HCL ER (XL) 300 MG PO TB24: 300.0000 mg | ORAL_TABLET | Freq: Every day | ORAL | 0 refills | Status: DC

## 2017-04-09 ENCOUNTER — Telehealth: Payer: Self-pay | Admitting: Family Medicine

## 2017-04-09 ENCOUNTER — Ambulatory Visit (INDEPENDENT_AMBULATORY_CARE_PROVIDER_SITE_OTHER): Payer: BLUE CROSS/BLUE SHIELD | Admitting: Family Medicine

## 2017-04-09 VITALS — BP 130/82 | HR 78 | Temp 98.2°F | Wt 173.0 lb

## 2017-04-09 DIAGNOSIS — R350 Frequency of micturition: Secondary | ICD-10-CM

## 2017-04-09 LAB — POCT URINALYSIS DIPSTICK
Glucose, UA: NEGATIVE
Ketones, UA: NEGATIVE
Nitrite, UA: NEGATIVE
Spec Grav, UA: 1.025
Urobilinogen, UA: 0.2 U/dL
pH, UA: 6

## 2017-04-09 MED ORDER — CIPROFLOXACIN HCL 250 MG PO TABS: 250.0000 mg | ORAL_TABLET | Freq: Two times a day (BID) | ORAL | 0 refills | Status: AC

## 2017-04-09 NOTE — Telephone Encounter (Signed)
Scheduled patient to come in to be seen today.

## 2017-04-09 NOTE — Telephone Encounter (Signed)
Patient called in stating she has another "UTI". Patient stated she is "urinating a lot and urine is cloudy". Patient would like to know if something can be prescribed or if she needs to come in and be seen. Please advise.

## 2017-04-10 ENCOUNTER — Encounter: Payer: Self-pay | Admitting: Family Medicine

## 2017-04-10 LAB — URINE CULTURE
MICRO NUMBER:: 81278100
SPECIMEN QUALITY:: ADEQUATE

## 2017-04-10 NOTE — Progress Notes (Signed)
Tara Garner is a 61 y.o. female here for an acute visit.  History of Present Illness:   Urinary Tract Infection   This is a new problem. The current episode started in the past 7 days. The problem has been gradually worsening. The pain is mild. There has been no fever. There is no history of pyelonephritis. Associated symptoms include frequency. Pertinent negatives include no chills, discharge, flank pain, hematuria, nausea, sweats or vomiting. Her past medical history is significant for recurrent UTIs.   PMHx, SurgHx, SocialHx, Medications, and Allergies were reviewed in the Visit Navigator and updated as appropriate.  Current Medications:   .  albuterol (PROVENTIL HFA;VENTOLIN HFA) 108 (90 Base) MCG/ACT inhaler, Inhale 2 puffs into the lungs every 6 (six) hours as needed for wheezing or shortness of breath., Disp: , Rfl:  .  aspirin EC 81 MG tablet, Take 81 mg by mouth daily., Disp: , Rfl:  .  buPROPion (WELLBUTRIN XL) 300 MG 24 hr tablet, Take 1 tablet (300 mg total) daily by mouth., Disp: 90 tablet, Rfl: 0 .  Calcium Carbonate-Vitamin D (CALCIUM 500/D PO), Take 500 mg by mouth daily., Disp: , Rfl:  .  cholecalciferol (VITAMIN D) 1000 units tablet, Take 4,000 Units by mouth daily., Disp: , Rfl:  .  fluticasone (FLONASE) 50 MCG/ACT nasal spray, Place into both nostrils daily as needed for allergies or rhinitis., Disp: , Rfl:  .  Fluticasone-Salmeterol (ADVAIR) 250-50 MCG/DOSE AEPB, Inhale 1 puff into the lungs 2 (two) times daily as needed., Disp: , Rfl:  .  loperamide (ANTI-DIARRHEAL) 2 MG tablet, Take 2 mg as needed by mouth for diarrhea or loose stools., Disp: , Rfl:  .  Mesalamine 800 MG TBEC, Take 3 tablets (2,400 mg total) by mouth 2 (two) times daily., Disp: 540 tablet, Rfl: 3 .  metoprolol succinate (TOPROL XL) 100 MG 24 hr tablet, Take 1 tablet (100 mg total) by mouth daily. Take with or immediately following a meal., Disp: 90 tablet, Rfl: 1 .  montelukast (SINGULAIR) 10 MG  tablet, Take 1 tablet (10 mg total) by mouth at bedtime., Disp: 30 tablet, Rfl: 6 .  Multiple Vitamin (MULTIVITAMIN) tablet, Take 1 tablet daily by mouth., Disp: , Rfl:  .  pantoprazole (PROTONIX) 40 MG tablet, TAKE 1 TABLET BY MOUTH TWICE A DAY, Disp: 60 tablet, Rfl: 1 .  ranitidine (ZANTAC) 150 MG tablet, Take 2 tablets (300 mg total) at bedtime by mouth., Disp: 60 tablet, Rfl: 3 .  ranitidine (ZANTAC) 300 MG tablet, Take 1 tablet (300 mg total) by mouth at bedtime., Disp: 30 tablet, Rfl: 0 .  VICTOZA 18 MG/3ML SOPN, Inject 0.2 mLs (1.2 mg total) into the skin daily. Inject once daily at the same time, Disp: 9 pen, Rfl: 1   Allergies  Allergen Reactions  . Avelox [Moxifloxacin]     Cannot recall  . Benicar [Olmesartan]     Cannot recall  . Ceftin [Cefuroxime]     Cannot recall  . Codeine     Hallucinations  . Imuran [Azathioprine]     Instigates cough  . Indocin [Indomethacin]     Cannot recall reaction  . Methotrexate Derivatives     Instigates cough   Review of Systems:   Pertinent items are noted in the HPI. Otherwise, ROS is negative.  Vitals:   Vitals:   04/09/17 1343  BP: 130/82  Pulse: 78  Temp: 98.2 F (36.8 C)  TempSrc: Oral  SpO2: 99%  Weight: 173 lb (78.5 kg)  Body mass index is 34.94 kg/m.   Physical Exam:   Physical Exam  Constitutional: She appears well-nourished.  HENT:  Head: Normocephalic and atraumatic.  Eyes: EOM are normal. Pupils are equal, round, and reactive to light.  Neck: Normal range of motion. Neck supple.  Cardiovascular: Normal rate, regular rhythm, normal heart sounds and intact distal pulses.  Pulmonary/Chest: Effort normal.  Abdominal: Soft.  Skin: Skin is warm.  Psychiatric: She has a normal mood and affect. Her behavior is normal.  Nursing note and vitals reviewed.   Results for orders placed or performed in visit on 04/09/17  Urine Culture  Result Value Ref Range   MICRO NUMBER: [id number]    SPECIMEN QUALITY:  ADEQUATE    Sample Source NOT GIVEN    STATUS: FINAL    ISOLATE 1:      Three or more organisms present, each greater than 10,000 cu/mL. May represent normal flora contamination from external genitalia. No further testing is required.  POCT urinalysis dipstick  Result Value Ref Range   Color, UA Yellow    Clarity, UA Cloudy    Glucose, UA Negative    Bilirubin, UA 1+    Ketones, UA Negative    Spec Grav, UA 1.025 1.010 - 1.025   Blood, UA 3+    pH, UA 6.0 5.0 - 8.0   Protein, UA 1+    Urobilinogen, UA 0.2 0.2 or 1.0 E.U./dL   Nitrite, UA Negative    Leukocytes, UA Large (3+) (A) Negative    Assessment and Plan:   Diagnoses and all orders for this visit:  Frequent urination, concern for UTI -     POCT urinalysis dipstick -     ciprofloxacin (CIPRO) 250 MG tablet; Take 1 tablet (250 mg total) 2 (two) times daily for 7 days by mouth. -     Urine Culture   . Reviewed expectations re: course of current medical issues. . Discussed self-management of symptoms. . Outlined signs and symptoms indicating need for more acute intervention. . Patient verbalized understanding and all questions were answered. Marland Kitchen Health Maintenance issues including appropriate healthy diet, exercise, and smoking avoidance were discussed with patient. . See orders for this visit as documented in the electronic medical record. . Patient received an After Visit Summary.  Briscoe Deutscher, DO [place] 04/10/2017  Future Appointments  Date Time Provider [place]  05/08/2017 10:30 AM [facility] None  05/13/2017  1:15 PM Elayne Snare, MD [facility] None

## 2017-04-17 ENCOUNTER — Other Ambulatory Visit: Payer: Self-pay

## 2017-04-17 MED ORDER — PANTOPRAZOLE SODIUM 40 MG PO TBEC: 40.0000 mg | DELAYED_RELEASE_TABLET | Freq: Two times a day (BID) | ORAL | 3 refills | Status: DC

## 2017-04-24 ENCOUNTER — Other Ambulatory Visit: Payer: Self-pay

## 2017-04-24 MED ORDER — MONTELUKAST SODIUM 10 MG PO TABS: 10.0000 mg | ORAL_TABLET | Freq: Every day | ORAL | 1 refills | Status: DC

## 2017-04-25 ENCOUNTER — Other Ambulatory Visit: Payer: Self-pay

## 2017-04-25 MED ORDER — PANTOPRAZOLE SODIUM 40 MG PO TBEC: 40.0000 mg | DELAYED_RELEASE_TABLET | Freq: Two times a day (BID) | ORAL | 3 refills | Status: DC

## 2017-04-25 MED ORDER — RANITIDINE HCL 300 MG PO TABS: 300.0000 mg | ORAL_TABLET | Freq: Every day | ORAL | 3 refills | Status: DC

## 2017-05-08 ENCOUNTER — Telehealth: Payer: Self-pay | Admitting: Gastroenterology

## 2017-05-08 ENCOUNTER — Other Ambulatory Visit (INDEPENDENT_AMBULATORY_CARE_PROVIDER_SITE_OTHER): Payer: BLUE CROSS/BLUE SHIELD

## 2017-05-08 DIAGNOSIS — E669 Obesity, unspecified: Secondary | ICD-10-CM | POA: Diagnosis not present

## 2017-05-08 DIAGNOSIS — E1169 Type 2 diabetes mellitus with other specified complication: Secondary | ICD-10-CM | POA: Diagnosis not present

## 2017-05-08 LAB — COMPREHENSIVE METABOLIC PANEL
ALBUMIN: 3.9 g/dL (ref 3.5–5.2)
ALK PHOS: 74 U/L (ref 39–117)
ALT: 18 U/L (ref 0–35)
AST: 23 U/L (ref 0–37)
BUN: 25 mg/dL — ABNORMAL HIGH (ref 6–23)
CO2: 25 mEq/L (ref 19–32)
Calcium: 9 mg/dL (ref 8.4–10.5)
Chloride: 105 mEq/L (ref 96–112)
Creatinine, Ser: 1.36 mg/dL — ABNORMAL HIGH (ref 0.40–1.20)
GFR: 41.79 mL/min — AB (ref >60.00)
Glucose, Bld: 129 mg/dL — ABNORMAL HIGH (ref 70–99)
POTASSIUM: 4.1 meq/L (ref 3.5–5.1)
Sodium: 137 mEq/L (ref 135–145)
TOTAL PROTEIN: 6.6 g/dL (ref 6.0–8.3)
Total Bilirubin: 0.4 mg/dL (ref 0.2–1.2)

## 2017-05-08 LAB — HEMOGLOBIN A1C: HEMOGLOBIN A1C: 6 % (ref 4.6–6.5)

## 2017-05-08 NOTE — Telephone Encounter (Signed)
Colonoscopy 12/2013 Dr. Frederik Pear  Indications "patient with Crohn's colitis, colonic adenomas and family history of colon cancer.  She was last colonoscoped in 2012 and a 3-4-year reexam was recommended"  Findings examination to the cecum.  Terminal ileum was not entered.  "There was evidence of chronic inflammation rectosigmoid with a granular appearance" no other inflammation was noted.  A 9 mm polyp was removed from the cecum.  A 7 mm polyp was removed from the hepatic flexure.  Random biopsies were also taken.    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  The biopsies from the right colon and left colon were completely normal.

## 2017-05-13 ENCOUNTER — Encounter: Payer: Self-pay | Admitting: Endocrinology

## 2017-05-13 ENCOUNTER — Ambulatory Visit (INDEPENDENT_AMBULATORY_CARE_PROVIDER_SITE_OTHER): Payer: BLUE CROSS/BLUE SHIELD | Admitting: Endocrinology

## 2017-05-13 VITALS — BP 134/82 | HR 85 | Ht 59.0 in | Wt 173.2 lb

## 2017-05-13 DIAGNOSIS — E669 Obesity, unspecified: Secondary | ICD-10-CM

## 2017-05-13 DIAGNOSIS — E1169 Type 2 diabetes mellitus with other specified complication: Secondary | ICD-10-CM

## 2017-05-13 NOTE — Progress Notes (Signed)
Patient ID: Tara Garner, female   DOB: 06/28/1955, 61 y.o.   MRN: [id number]            Reason for Appointment:  Follow-up for Type 2 Diabetes  Referring physician: Briscoe Deutscher   History of Present Illness:          Date of diagnosis of type 2 diabetes mellitus:   2006       Background history:   She was probably treated with metformin initially, she does not remember the details Started insulin in 2007 presumably for poor control She thinks that until a couple of years ago she was followed by an endocrinologist in [place] and then PCP in [place] However A1c would usually be around 8% Also in 2015 she was hospitalized for unknown reasons and developed encephalopathy and diabetic ketoacidosis, was in a nursing home for about a month subsequently and was continued on insulin About a year ago she lost about 70 pounds with cutting back on portions   On her initial consultation she was on basal bolus insulin regimen with tendency to hypoglycemia especially in early morning and after breakfast  Recent history:   INSULIN regimen is:   none  Non-insulin hypoglycemic drugs the patient is taking are: Victoza 0.9 mg daily  Her A1c has been very consistent around 6%  Current management, blood sugar patterns and problems identified:  She has check blood sugars very sporadically because of traveling and other family events  She has continued Victoza but because of her significant anorexia this was changed to 0.9 instead of 1.2 mg in September  With this she is not having any excessive suppression of appetite  She thinks that because of traveling and eating out is not planning her meals she has not been able to lose any further weight which she had been doing  Also not doing any specific physical exercise except walking while shopping  Fasting readings are fairly good, AVERAGE 95 with a range of 85-100 recently  She did have a low blood sugar last week around 5 PM, she  had a late breakfast of waffles only and was more active that day.  No other low sugars       Side effects from medications have been: None  Compliance with the medical regimen: Good Hypoglycemia: None recently  Glucose monitoring:  done <1 times a day         Glucometer:  Freestyle .      Blood Glucose readings by time of day and averages from meter download:  Mean values apply above for all meters except median for One Touch  PRE-MEAL Fasting Lunch Dinner Bedtime Overall  Glucose range: 95-1 07       Mean/median: 95     102     Self-care: The diet that the patient has been following is: tries to limit Portions .     Meal times are:  Breakfast is at 9-10 am  Lunch: Dinner:   Typical meal intake: Breakfast is  variable, sometimes English muffin/bagel, juice.  Lunch lite, fruit; dinner is meat and potato, snacks: Apple cheese crackers or cookies              Dietician visit, most recent: Around diagnosis time CDE visit: 6/18               Exercise: a little walking  Weight history:  maximum weight 260, most of her weight loss was in early 2017   Wt Readings from Last  3 Encounters:  05/13/17 173 lb 3.2 oz (78.6 kg)  04/09/17 173 lb (78.5 kg)  04/02/17 173 lb 6 oz (78.6 kg)    Glycemic control:   Lab Results  Component Value Date   HGBA1C 6.0 05/08/2017   HGBA1C 5.9 02/01/2017   HGBA1C 6.0 12/12/2016   Lab Results  Component Value Date   MICROALBUR <0.7 09/12/2016   LDLCALC 82 09/12/2016   CREATININE 1.36 (H) 05/08/2017   Lab Results  Component Value Date   MICRALBCREAT 0.7 09/12/2016    Lab Results  Component Value Date   FRUCTOSAMINE 314 (H) 12/04/2016      Allergies as of 05/13/2017      Reactions   Avelox [moxifloxacin]    Cannot recall   Benicar [olmesartan]    Cannot recall   Ceftin [cefuroxime]    Cannot recall   Codeine    Hallucinations   Imuran [azathioprine]    Instigates cough   Indocin [indomethacin]    Cannot recall reaction    Methotrexate Derivatives    Instigates cough      Medication List        Accurate as of 05/13/17  2:57 PM. Always use your most recent med list.          albuterol 108 (90 Base) MCG/ACT inhaler Commonly known as:  PROVENTIL HFA;VENTOLIN HFA Inhale 2 puffs into the lungs every 6 (six) hours as needed for wheezing or shortness of breath.   ANTI-DIARRHEAL 2 MG tablet Generic drug:  loperamide Take 2 mg as needed by mouth for diarrhea or loose stools.   aspirin EC 81 MG tablet Take 81 mg by mouth daily.   buPROPion 300 MG 24 hr tablet Commonly known as:  WELLBUTRIN XL Take 1 tablet (300 mg total) daily by mouth.   CALCIUM 500/D PO Take 500 mg by mouth daily.   cholecalciferol 1000 units tablet Commonly known as:  VITAMIN D Take 4,000 Units by mouth daily.   fluticasone 50 MCG/ACT nasal spray Commonly known as:  FLONASE Place into both nostrils daily as needed for allergies or rhinitis.   Fluticasone-Salmeterol 250-50 MCG/DOSE Aepb Commonly known as:  ADVAIR Inhale 1 puff into the lungs 2 (two) times daily as needed.   Mesalamine 800 MG Tbec Take 3 tablets (2,400 mg total) by mouth 2 (two) times daily.   metoprolol succinate 100 MG 24 hr tablet Commonly known as:  TOPROL XL Take 1 tablet (100 mg total) by mouth daily. Take with or immediately following a meal.   montelukast 10 MG tablet Commonly known as:  SINGULAIR Take 1 tablet (10 mg total) by mouth at bedtime.   multivitamin tablet Take 1 tablet daily by mouth.   pantoprazole 40 MG tablet Commonly known as:  PROTONIX Take 1 tablet (40 mg total) by mouth 2 (two) times daily.   ranitidine 300 MG tablet Commonly known as:  ZANTAC Take 1 tablet (300 mg total) by mouth at bedtime.   VICTOZA 18 MG/3ML Sopn Generic drug:  liraglutide Inject 0.2 mLs (1.2 mg total) into the skin daily. Inject once daily at the same time       Allergies:  Allergies  Allergen Reactions  . Avelox [Moxifloxacin]     Cannot  recall  . Benicar [Olmesartan]     Cannot recall  . Ceftin [Cefuroxime]     Cannot recall  . Codeine     Hallucinations  . Imuran [Azathioprine]     Instigates cough  . Indocin [Indomethacin]  Cannot recall reaction  . Methotrexate Derivatives     Instigates cough    Past Medical History:  Diagnosis Date  . Acid reflux   . Asthma   . Cough due to ACE inhibitor   . Depression, recurrent ([place]) 09/16/2016  . Diabetes mellitus without complication ([place])   . DVT (deep venous thrombosis) ([place])   . Hypertension   . Morbid obesity ([place]) 09/16/2016  . Seasonal allergic rhinitis due to pollen 09/16/2016  . Sleep apnea   . Ulcerative colitis [facility])     Past Surgical History:  Procedure Laterality Date  . ABDOMINAL HYSTERECTOMY    . BREAST BIOPSY Left    benign  . CATARACT EXTRACTION    . CESAREAN SECTION    . KNEE SURGERY Left   . SHOULDER SURGERY Right     Family History  Problem Relation Age of Onset  . Diabetes Mother   . Dementia Mother   . Stroke Mother   . Coronary artery disease Mother   . Cancer Brother   . Diverticulitis Brother   . COPD Father   . Breast cancer Cousin     Social History:  reports that  has never smoked. she has never used smokeless tobacco. She reports that she does not drink alcohol or use drugs.   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  No history of retinopathy or nephropathy  She had her influenza vaccine at the drugstore   Physical Examination:  BP 134/82   Pulse 85   Ht 4' 11"  (1.499 m)   Wt 173 lb 3.2 oz  (78.6 kg)   SpO2 97%   BMI 34.98 kg/m      ASSESSMENT:  Diabetes type 2, with obesity  See history of present illness for  discussion of current diabetes management, blood sugar patterns and problems identified     A1c is consistent at 6% This is with low dose Victoza only Overall she is generally trying to do well her diet but recently has not been able to be as consistent and has not lost weight Her blood sugars at home and in the lab look fairly close to normal  She had an unusual episode of low blood sugar with a glucose of 57 probably triggered by a high carbohydrate low protein meal and increased activity  PLAN:    She will continue 0.6 mg +5 clicks on Victoza  Discussed need to add protein to her meals especially breakfast consistently  She needs to start checking blood sugars after meals regularly and  alternating with fasting readings  Encouraged her to increase physical activity also, she does not want to go for water exercises as suggested    Patient Instructions  Check blood sugars on waking up  2-3/7  Also check blood sugars about 2 hours after a meal and do this after different meals by rotation  Recommended blood sugar levels on waking up is 90-130 and about 2 hours after meal is 130-160  Please bring your blood sugar  monitor to each visit, thank you      Elayne Snare 05/13/2017, 2:57 PM   Note: This office note was prepared with Dragon voice recognition system technology. Any transcriptional errors that result from this process are unintentional.

## 2017-05-13 NOTE — Patient Instructions (Addendum)
Check blood sugars on waking up  2-3/7  Also check blood sugars about 2 hours after a meal and do this after different meals by rotation  Recommended blood sugar levels on waking up is 90-130 and about 2 hours after meal is 130-160  Please bring your blood sugar monitor to each visit, thank you

## 2017-06-21 ENCOUNTER — Telehealth: Payer: Self-pay | Admitting: Family Medicine

## 2017-06-21 NOTE — Telephone Encounter (Addendum)
Spoke with patient and explained that she would have to be seen for her symptoms per Dr. Juleen China. We do not have anything available to so I gave her the [place] number to be seen at the Saturday clinic.

## 2017-06-21 NOTE — Telephone Encounter (Signed)
Pt called in because she is having some congestion, cough, runny nose. Pt would like to know if provider would sent something in to pharmacy for her?    [facility] [address]   CB: [phone]

## 2017-07-04 ENCOUNTER — Other Ambulatory Visit: Payer: Self-pay | Admitting: Family Medicine

## 2017-07-15 ENCOUNTER — Other Ambulatory Visit: Payer: Self-pay | Admitting: Family Medicine

## 2017-07-15 ENCOUNTER — Other Ambulatory Visit: Payer: Self-pay | Admitting: Gastroenterology

## 2017-08-15 ENCOUNTER — Telehealth: Payer: Self-pay | Admitting: Endocrinology

## 2017-08-15 NOTE — Telephone Encounter (Signed)
Patient

## 2017-08-19 ENCOUNTER — Telehealth: Payer: Self-pay | Admitting: Endocrinology

## 2017-08-19 NOTE — Telephone Encounter (Signed)
Patient stated she call last week about getting a new meter. Her meter is no longer worker. Patient is using the Freestyle Freedom Light      [id number] [address]  - [address] [place]

## 2017-08-21 ENCOUNTER — Encounter: Payer: Self-pay | Admitting: Family Medicine

## 2017-08-21 ENCOUNTER — Ambulatory Visit (INDEPENDENT_AMBULATORY_CARE_PROVIDER_SITE_OTHER): Payer: BLUE CROSS/BLUE SHIELD | Admitting: Family Medicine

## 2017-08-21 VITALS — BP 134/78 | HR 79 | Temp 97.3°F | Ht 59.0 in | Wt 172.4 lb

## 2017-08-21 DIAGNOSIS — E1122 Type 2 diabetes mellitus with diabetic chronic kidney disease: Secondary | ICD-10-CM | POA: Diagnosis not present

## 2017-08-21 DIAGNOSIS — M79672 Pain in left foot: Secondary | ICD-10-CM | POA: Diagnosis not present

## 2017-08-21 DIAGNOSIS — N183 Type 2 diabetes mellitus with diabetic chronic kidney disease: Secondary | ICD-10-CM

## 2017-08-21 DIAGNOSIS — Z1159 Encounter for screening for other viral diseases: Secondary | ICD-10-CM

## 2017-08-21 DIAGNOSIS — M79671 Pain in right foot: Secondary | ICD-10-CM | POA: Diagnosis not present

## 2017-08-21 DIAGNOSIS — M2041 Other hammer toe(s) (acquired), right foot: Secondary | ICD-10-CM | POA: Diagnosis not present

## 2017-08-21 DIAGNOSIS — M19049 Primary osteoarthritis, unspecified hand: Secondary | ICD-10-CM

## 2017-08-21 DIAGNOSIS — M76821 Posterior tibial tendinitis, right leg: Secondary | ICD-10-CM | POA: Diagnosis not present

## 2017-08-21 DIAGNOSIS — R7989 Other specified abnormal findings of blood chemistry: Secondary | ICD-10-CM | POA: Diagnosis not present

## 2017-08-21 DIAGNOSIS — Z114 Encounter for screening for human immunodeficiency virus [HIV]: Secondary | ICD-10-CM | POA: Diagnosis not present

## 2017-08-21 MED ORDER — DICLOFENAC SODIUM 2 % TD SOLN: TRANSDERMAL | 0 refills | Status: DC

## 2017-08-21 MED ORDER — NYSTATIN-TRIAMCINOLONE 100000-0.1 UNIT/GM-% EX OINT: 1.0000 "application " | TOPICAL_OINTMENT | Freq: Two times a day (BID) | CUTANEOUS | 0 refills | Status: DC

## 2017-08-21 MED ORDER — BLOOD GLUCOSE MONITOR KIT: PACK | 0 refills | Status: DC

## 2017-08-21 NOTE — Progress Notes (Signed)
Tara Garner is a 62 y.o. female is here for follow up.  History of Present Illness:   Tara Garner, CMA acting as scribe for Dr. Briscoe Deutscher.   HPI: Patient in for follow up. She would like for [place] to order new glucometer. Hers has broken. She has been having increased pain in hands. She is using and over the counter cream that provides some relief.    Review of Systems  Constitutional: Negative for chills and fever.  HENT: Negative for ear pain and hearing loss.   Eyes: Negative for blurred vision and double vision.  Respiratory: Negative for cough.   Cardiovascular: Negative for chest pain and palpitations.  Gastrointestinal: Negative for nausea and vomiting.  Genitourinary: Positive for frequency. Negative for dysuria and urgency.       Patient has been having frequency at night.   Skin: Negative for rash.  Neurological: Negative for tingling and headaches.   Health Maintenance Due  Topic Date Due  . Hepatitis C Screening  04-01-1956  . HIV Screening  09/25/1969  . OPHTHALMOLOGY EXAM  10/12/2016   No flowsheet data found.   PMHx, SurgHx, SocialHx, FamHx, Medications, and Allergies were reviewed in the Visit Navigator and updated as appropriate.   Patient Active Problem List   Diagnosis Date Noted  . Anemia in stage 3 chronic kidney disease ([place]) 12/15/2016  . Localized osteoarthritis of right shoulder 10/31/2016  . Diabetic peripheral neuropathy associated with type 2 diabetes mellitus ([place]) 10/23/2016  . Ulcerative colitis with complication ([place]) 70/78/6754  . Chronic right shoulder pain 09/16/2016  . Morbid obesity ([place]) 09/16/2016  . Seasonal allergic rhinitis due to pollen 09/16/2016  . Gastroesophageal reflux disease without esophagitis 09/16/2016  . Depression, recurrent ([place]) 09/16/2016  . Essential hypertension 09/16/2016  . Moderate persistent asthma without complication 49/20/1007  . Type 2 diabetes mellitus with stage 3 chronic kidney disease  ([place]) 09/16/2016   Social History   Tobacco Use  . Smoking status: Never Smoker  . Smokeless tobacco: Never Used  Substance Use Topics  . Alcohol use: No  . Drug use: No   Current Medications and Allergies:   .  albuterol (PROVENTIL HFA;VENTOLIN HFA) 108 (90 Base) MCG/ACT inhaler, Inhale 2 puffs into the lungs every 6 (six) hours as needed for wheezing or shortness of breath., Disp: , Rfl:  .  aspirin EC 81 MG tablet, Take 81 mg by mouth daily., Disp: , Rfl:  .  buPROPion (WELLBUTRIN XL) 300 MG 24 hr tablet, TAKE 1 TABLET (300 MG TOTAL) DAILY BY MOUTH., Disp: 90 tablet, Rfl: 0 .  Calcium Carbonate-Vitamin D (CALCIUM 500/D PO), Take 500 mg by mouth daily., Disp: , Rfl:  .  cholecalciferol (VITAMIN D) 1000 units tablet, Take 4,000 Units by mouth daily., Disp: , Rfl:  .  fluticasone (FLONASE) 50 MCG/ACT nasal spray, Place into both nostrils daily as needed for allergies or rhinitis., Disp: , Rfl:  .  Fluticasone-Salmeterol (ADVAIR) 250-50 MCG/DOSE AEPB, Inhale 1 puff into the lungs 2 (two) times daily as needed., Disp: , Rfl:  .  loperamide (ANTI-DIARRHEAL) 2 MG tablet, Take 2 mg as needed by mouth for diarrhea or loose stools., Disp: , Rfl:  .  metoprolol succinate (TOPROL-XL) 100 MG 24 hr tablet, TAKE 1 TABLET (100 MG TOTAL) BY MOUTH DAILY. TAKE WITH OR IMMEDIATELY FOLLOWING A MEAL., Disp: 90 tablet, Rfl: 1 .  montelukast (SINGULAIR) 10 MG tablet, Take 1 tablet (10 mg total) by mouth at bedtime., Disp: 90 tablet, Rfl:  1 .  Multiple Vitamin (MULTIVITAMIN) tablet, Take 1 tablet daily by mouth., Disp: , Rfl:  .  pantoprazole (PROTONIX) 40 MG tablet, Take 1 tablet (40 mg total) by mouth 2 (two) times daily., Disp: 180 tablet, Rfl: 3 .  ranitidine (ZANTAC) 150 MG tablet, TAKE 2 TABLETS (300 MG TOTAL) AT BEDTIME BY MOUTH., Disp: 60 tablet, Rfl: 3 .  VICTOZA 18 MG/3ML SOPN, Inject 0.2 mLs (1.2 mg total) into the skin daily. Inject once daily at the same time, Disp: 9 pen, Rfl: 1 .  Mesalamine 800  MG TBEC, Take 3 tablets (2,400 mg total) by mouth 2 (two) times daily., Disp: 540 tablet, Rfl: 3   Allergies  Allergen Reactions  . Avelox [Moxifloxacin]     Cannot recall  . Benicar [Olmesartan]     Cannot recall  . Ceftin [Cefuroxime]     Cannot recall  . Codeine     Hallucinations  . Imuran [Azathioprine]     Instigates cough  . Indocin [Indomethacin]     Cannot recall reaction  . Methotrexate Derivatives     Instigates cough   Review of Systems   Pertinent items are noted in the HPI. Otherwise, ROS is negative.  Vitals:   Vitals:   08/21/17 1038  BP: 134/78  Pulse: 79  Temp: (!) 97.3 F (36.3 C)  TempSrc: Oral  SpO2: 97%  Weight: 172 lb 6.4 oz (78.2 kg)  Height: 4' 11"  (1.499 m)     Body mass index is 34.82 kg/m.  Physical Exam:   Physical Exam  Constitutional: She is oriented to person, place, and time. She appears well-developed and well-nourished. No distress.  HENT:  Head: Normocephalic and atraumatic.  Right Ear: External ear normal.  Left Ear: External ear normal.  Nose: Nose normal.  Mouth/Throat: Oropharynx is clear and moist.  Eyes: Pupils are equal, round, and reactive to light. Conjunctivae and EOM are normal.  Neck: Normal range of motion. Neck supple. No thyromegaly present.  Cardiovascular: Normal rate, regular rhythm, normal heart sounds and intact distal pulses.  Pulmonary/Chest: Effort normal and breath sounds normal.  Abdominal: Soft. Bowel sounds are normal.  Lymphadenopathy:    She has no cervical adenopathy.  Neurological: She is alert and oriented to person, place, and time.  Skin: Skin is warm and dry. Capillary refill takes less than 2 seconds.  Psychiatric: She has a normal mood and affect. Her behavior is normal.  Nursing note and vitals reviewed.   Assessment and Plan:   1. Encounter for screening for HIV - HIV antibody; Future  2. Encounter for hepatitis C screening test for low risk patient - Hepatitis C antibody;  Future  3. High serum high density lipoprotein (HDL) Lipid-lowering therapy was not prescribed due to patient refusal.  4. Hand arthritis [facility] samples.  5. Type 2 diabetes mellitus with stage 3 chronic kidney disease, without long-term current use of insulin (HCC) Prescribed glucometer.  . Reviewed expectations re: course of current medical issues. . Discussed self-management of symptoms. . Outlined signs and symptoms indicating need for more acute intervention. . Patient verbalized understanding and all questions were answered. Marland Kitchen Health Maintenance issues including appropriate healthy diet, exercise, and smoking avoidance were discussed with patient. . See orders for this visit as documented in the electronic medical record. . Patient received an After Visit Summary.  CMA served as Education administrator during this visit. History, Physical, and Plan performed by medical provider. The above documentation has been reviewed and is accurate and complete.  Briscoe Deutscher, D.O.  Briscoe Deutscher, DO [place], [place] 08/21/2017  Records requested if needed. Time spent with the patient: 25 minutes, of which >50% was spent in obtaining information about her symptoms, reviewing her previous labs, evaluations, and treatments, counseling her about her condition (please see the discussed topics above), and developing a plan to further investigate it; she had a number of questions which I addressed.

## 2017-08-28 ENCOUNTER — Other Ambulatory Visit: Payer: Self-pay

## 2017-08-28 MED ORDER — FREESTYLE FREEDOM LITE W/DEVICE KIT: PACK | 1 refills | Status: DC

## 2017-08-28 NOTE — Telephone Encounter (Signed)
This has been done.

## 2017-09-04 ENCOUNTER — Other Ambulatory Visit (INDEPENDENT_AMBULATORY_CARE_PROVIDER_SITE_OTHER): Payer: BLUE CROSS/BLUE SHIELD

## 2017-09-04 DIAGNOSIS — E1169 Type 2 diabetes mellitus with other specified complication: Secondary | ICD-10-CM

## 2017-09-04 DIAGNOSIS — E669 Obesity, unspecified: Secondary | ICD-10-CM | POA: Diagnosis not present

## 2017-09-04 LAB — COMPREHENSIVE METABOLIC PANEL
ALK PHOS: 60 U/L (ref 39–117)
ALT: 17 U/L (ref 0–35)
AST: 22 U/L (ref 0–37)
Albumin: 3.8 g/dL (ref 3.5–5.2)
BUN: 27 mg/dL — AB (ref 6–23)
CO2: 26 meq/L (ref 19–32)
Calcium: 9.1 mg/dL (ref 8.4–10.5)
Chloride: 108 mEq/L (ref 96–112)
Creatinine, Ser: 1.4 mg/dL — ABNORMAL HIGH (ref 0.40–1.20)
GFR: 40.37 mL/min — ABNORMAL LOW (ref >60.00)
GLUCOSE: 95 mg/dL (ref 70–99)
POTASSIUM: 4.5 meq/L (ref 3.5–5.1)
SODIUM: 141 meq/L (ref 135–145)
TOTAL PROTEIN: 6.4 g/dL (ref 6.0–8.3)
Total Bilirubin: 0.4 mg/dL (ref 0.2–1.2)

## 2017-09-04 LAB — LIPID PANEL
CHOL/HDL RATIO: 3
Cholesterol: 150 mg/dL (ref 0–200)
HDL: 45.3 mg/dL (ref >39.00)
LDL Cholesterol: 81 mg/dL (ref 0–99)
NONHDL: 104.85
Triglycerides: 119 mg/dL (ref 0.0–149.0)
VLDL: 23.8 mg/dL (ref 0.0–40.0)

## 2017-09-04 LAB — HEMOGLOBIN A1C: Hgb A1c MFr Bld: 6 % (ref 4.6–6.5)

## 2017-09-09 ENCOUNTER — Ambulatory Visit (INDEPENDENT_AMBULATORY_CARE_PROVIDER_SITE_OTHER): Payer: BLUE CROSS/BLUE SHIELD | Admitting: Endocrinology

## 2017-09-09 ENCOUNTER — Encounter: Payer: Self-pay | Admitting: Endocrinology

## 2017-09-09 VITALS — BP 134/82 | HR 75 | Ht 59.0 in | Wt 174.4 lb

## 2017-09-09 DIAGNOSIS — E1169 Type 2 diabetes mellitus with other specified complication: Secondary | ICD-10-CM | POA: Diagnosis not present

## 2017-09-09 DIAGNOSIS — E669 Obesity, unspecified: Secondary | ICD-10-CM | POA: Diagnosis not present

## 2017-09-09 DIAGNOSIS — E11610 Type 2 diabetes mellitus with diabetic neuropathic arthropathy: Secondary | ICD-10-CM | POA: Diagnosis not present

## 2017-09-09 NOTE — Progress Notes (Signed)
Patient ID: Tara Garner, female   DOB: 12/31/1955, 62 y.o.   MRN: [id number]            Reason for Appointment:  Follow-up for Type 2 Diabetes  Referring physician: Briscoe Deutscher   History of Present Illness:          Date of diagnosis of type 2 diabetes mellitus:   2006       Background history:   She was probably treated with metformin initially, she does not remember the details Started insulin in 2007 presumably for poor control She thinks that until a couple of years ago she was followed by an endocrinologist in [place] and then PCP in [place] However A1c would usually be around 8% Also in 2015 she was hospitalized for unknown reasons and developed encephalopathy and diabetic ketoacidosis, was in a nursing home for about a month subsequently and was continued on insulin About a year ago she lost about 70 pounds with cutting back on portions   On her initial consultation she was on basal bolus insulin regimen with tendency to hypoglycemia especially in early morning and after breakfast  Recent history:   Non-insulin hypoglycemic drugs the patient is taking are: Victoza 0.9 mg daily  Her A1c has been very consistent around 6% and is the same now  Current management, blood sugar patterns and problems identified:  She has check blood sugars inconsistently and only in the morning usually  These blood sugars are excellent  She did have a low blood sugar of 54 before suppertime but she thinks she may have been late for her meal and was relatively more active, also may have had a flareup of her colitis before that  She still has difficulty losing weight and this may be partly from her being on vacation recently  Still not doing blood sugar readings after meals as discussed before       Side effects from medications have been: None  Compliance with the medical regimen: Good Hypoglycemia: None recently  Glucose monitoring:  done <1 times a day          Glucometer:  Freestyle .       Blood Glucose readings by time of day and averages from meter download:  Fasting blood sugars range 85-100, overall average 93  Self-care: The diet that the patient has been following is: tries to limit Portions .     Meal times are:  Breakfast is at 9-10 am  Lunch: Dinner:   Typical meal intake: Breakfast is  variable, sometimes English muffin/bagel, juice.  Lunch lite, fruit; dinner is meat and potato, snacks: Apple cheese crackers or cookies              Dietician visit, most recent: Around diagnosis time CDE visit: 6/18               Exercise: a little walking  Weight history:  maximum weight 260, most of her weight loss was in early 2017   Wt Readings from Last 3 Encounters:  09/09/17 174 lb 6.4 oz (79.1 kg)  08/21/17 172 lb 6.4 oz (78.2 kg)  05/13/17 173 lb 3.2 oz (78.6 kg)    Glycemic control:   Lab Results  Component Value Date   HGBA1C 6.0 09/04/2017   HGBA1C 6.0 05/08/2017   HGBA1C 5.9 02/01/2017   Lab Results  Component Value Date   MICROALBUR <0.7 09/12/2016   LDLCALC 81 09/04/2017   CREATININE 1.40 (H) 09/04/2017   Lab Results  Component Value Date   MICRALBCREAT 0.7 09/12/2016    Lab Results  Component Value Date   FRUCTOSAMINE 314 (H) 12/04/2016      Allergies as of 09/09/2017      Reactions   Avelox [moxifloxacin]    Cannot recall   Benicar [olmesartan]    Cannot recall   Ceftin [cefuroxime]    Cannot recall   Codeine    Hallucinations   Imuran [azathioprine]    Instigates cough   Indocin [indomethacin]    Cannot recall reaction   Methotrexate Derivatives    Instigates cough      Medication List        Accurate as of 09/09/17  1:00 PM. Always use your most recent med list.          albuterol 108 (90 Base) MCG/ACT inhaler Commonly known as:  PROVENTIL HFA;VENTOLIN HFA Inhale 2 puffs into the lungs every 6 (six) hours as needed for wheezing or shortness of breath.   ANTI-DIARRHEAL 2 MG  tablet Generic drug:  loperamide Take 2 mg as needed by mouth for diarrhea or loose stools.   aspirin EC 81 MG tablet Take 81 mg by mouth daily.   blood glucose meter kit and supplies Kit Dispense based on patient and insurance preference. Use up to four times daily as directed. (FOR ICD-9 250.00, 250.01).   buPROPion 300 MG 24 hr tablet Commonly known as:  WELLBUTRIN XL TAKE 1 TABLET (300 MG TOTAL) DAILY BY MOUTH.   CALCIUM 500/D PO Take 500 mg by mouth daily.   cholecalciferol 1000 units tablet Commonly known as:  VITAMIN D Take 4,000 Units by mouth daily.   Diclofenac Sodium 2 % Soln Commonly known as:  PENNSAID Small amount to area   fluticasone 50 MCG/ACT nasal spray Commonly known as:  FLONASE Place into both nostrils daily as needed for allergies or rhinitis.   Fluticasone-Salmeterol 250-50 MCG/DOSE Aepb Commonly known as:  ADVAIR Inhale 1 puff into the lungs 2 (two) times daily as needed.   FREESTYLE FREEDOM LITE w/Device Kit Use to test blood sugar daily   Mesalamine 800 MG Tbec Take 3 tablets (2,400 mg total) by mouth 2 (two) times daily.   metoprolol succinate 100 MG 24 hr tablet Commonly known as:  TOPROL-XL TAKE 1 TABLET (100 MG TOTAL) BY MOUTH DAILY. TAKE WITH OR IMMEDIATELY FOLLOWING A MEAL.   montelukast 10 MG tablet Commonly known as:  SINGULAIR Take 1 tablet (10 mg total) by mouth at bedtime.   multivitamin tablet Take 1 tablet daily by mouth.   nystatin-triamcinolone ointment Commonly known as:  MYCOLOG Apply 1 application topically 2 (two) times daily.   pantoprazole 40 MG tablet Commonly known as:  PROTONIX Take 1 tablet (40 mg total) by mouth 2 (two) times daily.   ranitidine 150 MG tablet Commonly known as:  ZANTAC TAKE 2 TABLETS (300 MG TOTAL) AT BEDTIME BY MOUTH.   VICTOZA 18 MG/3ML Sopn Generic drug:  liraglutide Inject 0.2 mLs (1.2 mg total) into the skin daily. Inject once daily at the same time       Allergies:   Allergies  Allergen Reactions  . Avelox [Moxifloxacin]     Cannot recall  . Benicar [Olmesartan]     Cannot recall  . Ceftin [Cefuroxime]     Cannot recall  . Codeine     Hallucinations  . Imuran [Azathioprine]     Instigates cough  . Indocin [Indomethacin]     Cannot recall reaction  .  Methotrexate Derivatives     Instigates cough    Past Medical History:  Diagnosis Date  . Acid reflux   . Asthma   . Cough due to ACE inhibitor   . Depression, recurrent ([place]) 09/16/2016  . Diabetes mellitus without complication ([place])   . DVT (deep venous thrombosis) ([place])   . Hypertension   . Morbid obesity ([place]) 09/16/2016  . Seasonal allergic rhinitis due to pollen 09/16/2016  . Sleep apnea   . Ulcerative colitis [facility]     Past Surgical History:  Procedure Laterality Date  . ABDOMINAL HYSTERECTOMY    . BREAST BIOPSY Left    benign  . CATARACT EXTRACTION    . CESAREAN SECTION    . KNEE SURGERY Left   . SHOULDER SURGERY Right     Family History  Problem Relation Age of Onset  . Diabetes Mother   . Dementia Mother   . Stroke Mother   . Coronary artery disease Mother   . Cancer Brother   . Diverticulitis Brother   . COPD Father   . Breast cancer Cousin     Social History:  reports that she has never smoked. She has never used smokeless tobacco. She reports that she does not drink alcohol or use drugs.   Review of Systems   Lipid history: Not on statins, LDL below 100    Lab Results  Component Value Date   CHOL 150 09/04/2017   HDL 45.30 09/04/2017   LDLCALC 81 09/04/2017   TRIG 119.0 09/04/2017   CHOLHDL 3 09/04/2017           Hypertension: Treated with metoprolol From previous physician  Most recent eye exam was 2017 has not made follow-up appointment  Most recent foot TDHR:4/16  Complications of diabetes: Peripheral neuropathy with sensory loss, Charcot foot.  No history of retinopathy or nephropathy    Physical Examination:  BP 134/82 (BP Location:  Left Arm, Patient Position: Sitting, Cuff Size: Large)   Pulse 75   Ht _0  (1.499 m)   Wt 174 lb 6.4 oz (79.1 kg)   SpO2 97%   BMI 35.22 kg/m      ASSESSMENT:  Diabetes type 2, with obesity  See history of present illness for  discussion of current diabetes management, blood sugar patterns and problems identified     A1c is consistent at 6% This is with low dose Victoza, use 0.9 mg With this she is not having anorexia that she had with higher doses However still has difficulty losing weight Currently checking blood sugars only in the mornings and not after meals as discussed She is asking about stopping Victoza  Again she has rare episodes of low blood sugars and difficult to explain these  Charcot foot: She is wanting a second opinion because her podiatrist has recommended multiple procedures and various shoes   PLAN:    She will continue 0.6 mg +5 clicks on Victoza  She will need to make sure she is eating on time and avoid high carbohydrate meals  She will try to increase her exercise level with more walking  More blood sugar monitoring after meals  Recommended continuing the Cozaar because of potential cardiovascular benefits and also to prevent any progression of her diabetes  She probably does still have some benefit of weight maintenance with this  She will be referred to an orthopedic surgeon for her Charcot foot   There are no Patient Instructions on file for this visit.   Tara Garner  Tara Garner 09/09/2017, 1:00 PM   Note: This office note was prepared with Dragon voice recognition system technology. Any transcriptional errors that result from this process are unintentional.

## 2017-09-09 NOTE — Patient Instructions (Signed)
Check blood sugars on waking up  2-3/7  Also check blood sugars about 2 hours after a meal and do this after different meals by rotation  Recommended blood sugar levels on waking up is 90-130 and about 2 hours after meal is 130-160  Please bring your blood sugar monitor to each visit, thank you

## 2017-09-11 ENCOUNTER — Ambulatory Visit: Payer: BLUE CROSS/BLUE SHIELD | Admitting: Endocrinology

## 2017-09-16 ENCOUNTER — Ambulatory Visit (INDEPENDENT_AMBULATORY_CARE_PROVIDER_SITE_OTHER): Payer: BLUE CROSS/BLUE SHIELD | Admitting: Orthopedic Surgery

## 2017-09-16 ENCOUNTER — Ambulatory Visit (INDEPENDENT_AMBULATORY_CARE_PROVIDER_SITE_OTHER): Payer: BLUE CROSS/BLUE SHIELD

## 2017-09-16 ENCOUNTER — Encounter (INDEPENDENT_AMBULATORY_CARE_PROVIDER_SITE_OTHER): Payer: Self-pay | Admitting: Orthopedic Surgery

## 2017-09-16 ENCOUNTER — Ambulatory Visit (INDEPENDENT_AMBULATORY_CARE_PROVIDER_SITE_OTHER): Payer: BLUE CROSS/BLUE SHIELD | Admitting: Family Medicine

## 2017-09-16 ENCOUNTER — Encounter: Payer: Self-pay | Admitting: Family Medicine

## 2017-09-16 ENCOUNTER — Ambulatory Visit (INDEPENDENT_AMBULATORY_CARE_PROVIDER_SITE_OTHER): Payer: Self-pay

## 2017-09-16 VITALS — BP 118/74 | HR 76 | Temp 98.1°F | Ht 59.0 in | Wt 175.4 lb

## 2017-09-16 DIAGNOSIS — K5190 Ulcerative colitis, unspecified, without complications: Secondary | ICD-10-CM

## 2017-09-16 DIAGNOSIS — E1142 Type 2 diabetes mellitus with diabetic polyneuropathy: Secondary | ICD-10-CM

## 2017-09-16 DIAGNOSIS — M76821 Posterior tibial tendinitis, right leg: Secondary | ICD-10-CM | POA: Diagnosis not present

## 2017-09-16 DIAGNOSIS — F339 Major depressive disorder, recurrent, unspecified: Secondary | ICD-10-CM | POA: Diagnosis not present

## 2017-09-16 DIAGNOSIS — M19042 Primary osteoarthritis, left hand: Secondary | ICD-10-CM

## 2017-09-16 DIAGNOSIS — M79672 Pain in left foot: Secondary | ICD-10-CM

## 2017-09-16 DIAGNOSIS — M19041 Primary osteoarthritis, right hand: Secondary | ICD-10-CM | POA: Diagnosis not present

## 2017-09-16 DIAGNOSIS — M205X2 Other deformities of toe(s) (acquired), left foot: Secondary | ICD-10-CM | POA: Diagnosis not present

## 2017-09-16 DIAGNOSIS — M79671 Pain in right foot: Secondary | ICD-10-CM | POA: Diagnosis not present

## 2017-09-16 MED ORDER — GABAPENTIN 100 MG PO CAPS: 100.0000 mg | ORAL_CAPSULE | Freq: Three times a day (TID) | ORAL | 0 refills | Status: DC

## 2017-09-16 NOTE — Progress Notes (Signed)
Tara Garner is a 62 y.o. female is here for follow up.  History of Present Illness:   HPI: Patient presents for discussion of pain control.  She has an elevated creatinine and has been taking ibuprofen as needed.  We discussed the importance of abstaining from any anti-inflammatories.  Her pain is twofold.  She has joint pains, especially in the knuckles and hips.  She has recently been diagnosed with Charcot foot.  She also describes paresthesias that are constant.  We did trial pen said at her last visit but the patient felt that it was too oily and did not work very well.  She did obtain a capsaicin containing cream that has been somewhat helpful for her hand pain.  Of note, the patient has a visit with Dr. Sharol Given today to address her foot.  Health Maintenance Due  Topic Date Due  . Hepatitis C Screening  06-18-1955  . HIV Screening  09/25/1969  . OPHTHALMOLOGY EXAM  10/12/2016  . URINE MICROALBUMIN  09/12/2017   No flowsheet data found.   PMHx, SurgHx, SocialHx, FamHx, Medications, and Allergies were reviewed in the Visit Navigator and updated as appropriate.   Patient Active Problem List   Diagnosis Date Noted  . Anemia in stage 3 chronic kidney disease ([place]) 12/15/2016  . Localized osteoarthritis of right shoulder 10/31/2016  . Diabetic peripheral neuropathy associated with type 2 diabetes mellitus ([place]) 10/23/2016  . Ulcerative colitis with complication ([place]) 36/14/4315  . Chronic right shoulder pain 09/16/2016  . Morbid obesity ([place]) 09/16/2016  . Seasonal allergic rhinitis due to pollen 09/16/2016  . Gastroesophageal reflux disease without esophagitis 09/16/2016  . Depression, recurrent ([place]) 09/16/2016  . Essential hypertension 09/16/2016  . Moderate persistent asthma without complication 40/12/6759  . Type 2 diabetes mellitus with stage 3 chronic kidney disease ([place]) 09/16/2016   Social History   Tobacco Use  . Smoking status: Never Smoker  . Smokeless tobacco:  Never Used  Substance Use Topics  . Alcohol use: No  . Drug use: No   Current Medications and Allergies:   Current Outpatient Medications:  .  albuterol (PROVENTIL HFA;VENTOLIN HFA) 108 (90 Base) MCG/ACT inhaler, Inhale 2 puffs into the lungs every 6 (six) hours as needed for wheezing or shortness of breath., Disp: , Rfl:  .  aspirin EC 81 MG tablet, Take 81 mg by mouth daily., Disp: , Rfl:  .  blood glucose meter kit and supplies KIT, Dispense based on patient and insurance preference. Use up to four times daily as directed. (FOR ICD-9 250.00, 250.01)., Disp: 1 each, Rfl: 0 .  Blood Glucose Monitoring Suppl (FREESTYLE FREEDOM LITE) w/Device KIT, Use to test blood sugar daily, Disp: 1 each, Rfl: 1 .  buPROPion (WELLBUTRIN XL) 300 MG 24 hr tablet, TAKE 1 TABLET (300 MG TOTAL) DAILY BY MOUTH., Disp: 90 tablet, Rfl: 0 .  Calcium Carbonate-Vitamin D (CALCIUM 500/D PO), Take 500 mg by mouth daily., Disp: , Rfl:  .  cholecalciferol (VITAMIN D) 1000 units tablet, Take 4,000 Units by mouth daily., Disp: , Rfl:  .  Diclofenac Sodium (PENNSAID) 2 % SOLN, Small amount to area, Disp: 1 Bottle, Rfl: 0 .  fluticasone (FLONASE) 50 MCG/ACT nasal spray, Place into both nostrils daily as needed for allergies or rhinitis., Disp: , Rfl:  .  Fluticasone-Salmeterol (ADVAIR) 250-50 MCG/DOSE AEPB, Inhale 1 puff into the lungs 2 (two) times daily as needed., Disp: , Rfl:  .  gabapentin (NEURONTIN) 100 MG capsule, Take 1 capsule (100  mg total) by mouth 3 (three) times daily., Disp: 30 capsule, Rfl: 0 .  loperamide (ANTI-DIARRHEAL) 2 MG tablet, Take 2 mg as needed by mouth for diarrhea or loose stools., Disp: , Rfl:  .  Mesalamine 800 MG TBEC, Take 3 tablets (2,400 mg total) by mouth 2 (two) times daily., Disp: 540 tablet, Rfl: 3 .  metoprolol succinate (TOPROL-XL) 100 MG 24 hr tablet, TAKE 1 TABLET (100 MG TOTAL) BY MOUTH DAILY. TAKE WITH OR IMMEDIATELY FOLLOWING A MEAL., Disp: 90 tablet, Rfl: 1 .  montelukast  (SINGULAIR) 10 MG tablet, Take 1 tablet (10 mg total) by mouth at bedtime., Disp: 90 tablet, Rfl: 1 .  Multiple Vitamin (MULTIVITAMIN) tablet, Take 1 tablet daily by mouth., Disp: , Rfl:  .  nystatin-triamcinolone ointment (MYCOLOG), Apply 1 application topically 2 (two) times daily., Disp: 60 g, Rfl: 0 .  pantoprazole (PROTONIX) 40 MG tablet, Take 1 tablet (40 mg total) by mouth 2 (two) times daily., Disp: 180 tablet, Rfl: 3 .  ranitidine (ZANTAC) 150 MG tablet, TAKE 2 TABLETS (300 MG TOTAL) AT BEDTIME BY MOUTH., Disp: 60 tablet, Rfl: 3 .  VICTOZA 18 MG/3ML SOPN, Inject 0.2 mLs (1.2 mg total) into the skin daily. Inject once daily at the same time (Patient taking differently: Inject 1.2 mg into the skin daily. Inject once daily at the same time), Disp: 9 pen, Rfl: 1   Allergies  Allergen Reactions  . Avelox [Moxifloxacin]     Cannot recall  . Benicar [Olmesartan]     Cannot recall  . Ceftin [Cefuroxime]     Cannot recall  . Codeine     Hallucinations  . Imuran [Azathioprine]     Instigates cough  . Indocin [Indomethacin]     Cannot recall reaction  . Methotrexate Derivatives     Instigates cough   Review of Systems   Pertinent items are noted in the HPI. Otherwise, ROS is negative.  Vitals:   Vitals:   09/16/17 1118  BP: 118/74  Pulse: 76  Temp: 98.1 F (36.7 C)  TempSrc: Oral  SpO2: 96%  Weight: 175 lb 6.4 oz (79.6 kg)  Height: 4' 11"  (1.499 m)     Body mass index is 35.43 kg/m.   Physical Exam:   Physical Exam  Constitutional: She appears well-nourished.  HENT:  Head: Normocephalic and atraumatic.  Eyes: Pupils are equal, round, and reactive to light. EOM are normal.  Neck: Normal range of motion. Neck supple.  Cardiovascular: Normal rate, regular rhythm, normal heart sounds and intact distal pulses.  Pulmonary/Chest: Effort normal.  Abdominal: Soft.  Skin: Skin is warm.  Psychiatric: She has a normal mood and affect. Her behavior is normal.  Nursing note  and vitals reviewed.   Assessment and Plan:   We discussed opportunities to improve pain.  She is okay to take Tylenol 1000 mg 3 times daily.  We discussed the option of breakthrough pain control as well as adding Neurontin.  She cannot remember being on a medication in the past.  She was okay with a low-dose trial.  Diagnoses and all orders for this visit:  Diabetic peripheral neuropathy associated with type 2 diabetes mellitus ([place]) -     gabapentin (NEURONTIN) 100 MG capsule; Take 1 capsule (100 mg total) by mouth 3 (three) times daily.  Primary osteoarthritis of both hands  Morbid obesity ([facility])  Depression, recurrent ([place])  Ulcerative colitis without complications, unspecified location ([place])   . Reviewed expectations re: course of current medical issues. Marland Kitchen  Discussed self-management of symptoms. . Outlined signs and symptoms indicating need for more acute intervention. . Patient verbalized understanding and all questions were answered. Marland Kitchen Health Maintenance issues including appropriate healthy diet, exercise, and smoking avoidance were discussed with patient. . See orders for this visit as documented in the electronic medical record. . Patient received an After Visit Summary.  Briscoe Deutscher, DO [place], [place] 09/16/2017  Future Appointments  Date Time Provider [place]  09/16/2017  2:45 PM Newt Minion, MD [facility] None  01/06/2018  1:15 PM [facility] None  01/09/2018  1:00 PM Elayne Snare, MD [facility] None

## 2017-09-16 NOTE — Progress Notes (Signed)
Office Visit Note   Patient: Tara Garner           Date of Birth: 1955-07-01           MRN: [id number] Visit Date: 09/16/2017              Requested by: Elayne Snare, MD [address] [place], [place] [address] PCP: Briscoe Deutscher, DO  Chief Complaint  Patient presents with  . Right Foot - Pain  . Left Foot - Pain      HPI: Patient is a 62 year old woman who presents for evaluation of both feet.  Patient states she is diabetic she has lost over 100 pounds and she is currently off insulin.  Patient was given a brace to wear for the right ankle.  Patient complains of painful clawing of the toes of the left foot and pes planus with rocker-bottom deformity medial column secondary to posterior tibial tendon insufficiency.  Assessment & Plan: Visit Diagnoses:  1. Pain in left foot   2. Pain in right foot   3. Diabetic polyneuropathy associated with type 2 diabetes mellitus ([employer])   4. Acquired claw toe, left   5. Posterior tibial tendinitis, right leg     Plan: Discussed with the patient that surgical intervention is an option but would recommend starting with a stiff soled walking shoe such as a new balance walking sneaker with extra-depth to see if this would relieve most of her symptoms.  Discussed that she would need care postoperatively for about a month that she would be off each foot.  Left foot would require a Weil osteotomy of the second third and fourth metatarsal PIP resection of the second third fourth and fifth toes with flexor to extensor transfers.  Discussed that with the peripheral vascular disease she would be an increased risk of wound complications.  Discussed that treatment for the right foot would require either a fusion of the medial column or triple arthrodesis.  Again this would require her to be off her foot for about a month.  Patient states she understands she will proceed with conservative therapy at this time she does not have to wear the ankle  stable brace on the right.  Follow-Up Instructions: Return if symptoms worsen or fail to improve.   Ortho Exam  Patient is alert, oriented, no adenopathy, well-dressed, normal affect, normal respiratory effort. Examination patient has a good dorsalis pedis pulse and posterior tibial pulse on both foot and ankle but radiographs do show dorsal vascular disease with calcification of the vessels.  She has good dorsiflexion to neutral on the left foot she has fixed clawing of the second third fourth and fifth toes with some callus dorsally from impingement in her shoe wear.  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  Imaging: Xr Foot Complete Left  Result Date: 09/16/2017 3 view radiographs of the left foot shows calcification of the dorsalis pedis and posterior tibial artery.  The alignment of the medial column is straight but she does have degenerative changes through the midfoot with a long second third and fourth metatarsal.  Xr Foot Complete Right  Result Date: 09/16/2017 3 view radiographs of the right foot shows calcification of the posterior tibial artery with a long  second third and fourth metatarsal with degenerative changes through the base of the second and third metatarsal with rocker-bottom deformity from posterior tibial tendon insufficiency with degenerative changes through the navicular medial cuneiform and base of the first metatarsal with pes planus.  No images are attached to the encounter.  Labs: Lab Results  Component Value Date   HGBA1C 6.0 09/04/2017   HGBA1C 6.0 05/08/2017   HGBA1C 5.9 02/01/2017    @LABSALLVALUES (HGBA1)@  There is no height or weight on file to calculate BMI.  Orders:    Orders Placed This Encounter  Procedures  . XR Foot Complete Right  . XR Foot Complete Left   No orders of the defined types were placed in this encounter.    Procedures: No procedures performed  Clinical Data: No additional findings.  ROS:  All other systems negative, except as noted in the HPI. Review of Systems  Objective: Vital Signs: There were no vitals taken for this visit.  Specialty Comments:  No specialty comments available.  PMFS History: Patient Active Problem List   Diagnosis Date Noted  . Acquired claw toe, left 09/16/2017  . Diabetic polyneuropathy associated with type 2 diabetes mellitus ([place]) 09/16/2017  . Posterior tibial tendinitis, right leg 09/16/2017  . Anemia in stage 3 chronic kidney disease ([place]) 12/15/2016  . Localized osteoarthritis of right shoulder 10/31/2016  . Diabetic peripheral neuropathy associated with type 2 diabetes mellitus ([place]) 10/23/2016  . Ulcerative colitis with complication ([place]) 20/81/3887  . Chronic right shoulder pain 09/16/2016  . Morbid obesity ([place]) 09/16/2016  . Seasonal allergic rhinitis due to pollen 09/16/2016  . Gastroesophageal reflux disease without esophagitis 09/16/2016  . Depression, recurrent ([place]) 09/16/2016  . Essential hypertension 09/16/2016  . Moderate persistent asthma without complication 19/59/7471  . Type 2 diabetes mellitus with stage 3 chronic kidney disease ([place]) 09/16/2016   Past Medical History:  Diagnosis Date  . Acid reflux   . Asthma   . Cough due to ACE inhibitor   . Depression, recurrent ([place]) 09/16/2016  . Diabetes mellitus without complication ([place])   . DVT (deep venous thrombosis) ([place])   . Hypertension   . Morbid obesity ([place]) 09/16/2016  . Seasonal allergic rhinitis due to pollen 09/16/2016  . Sleep apnea   . Ulcerative colitis ([place])     Family History  Problem Relation Age of Onset  . Diabetes Mother   . Dementia Mother   . Stroke Mother   . Coronary artery disease  Mother   . Cancer Brother   . Diverticulitis Brother   . COPD Father   . Breast cancer Cousin     Past Surgical History:  Procedure Laterality Date  . ABDOMINAL HYSTERECTOMY    . BREAST BIOPSY Left    benign  . CATARACT EXTRACTION    . CESAREAN SECTION    . KNEE SURGERY Left   . SHOULDER SURGERY Right    Social History   Occupational History  . Not on file  Tobacco Use  . Smoking status: Never Smoker  . Smokeless tobacco: Never Used  Substance and Sexual Activity  . Alcohol use: No  . Drug use: No  . Sexual activity: Not on file

## 2017-09-17 ENCOUNTER — Other Ambulatory Visit: Payer: Self-pay | Admitting: Family Medicine

## 2017-09-17 DIAGNOSIS — Z1231 Encounter for screening mammogram for malignant neoplasm of breast: Secondary | ICD-10-CM

## 2017-09-18 ENCOUNTER — Encounter: Payer: Self-pay | Admitting: Physician Assistant

## 2017-09-18 ENCOUNTER — Ambulatory Visit (INDEPENDENT_AMBULATORY_CARE_PROVIDER_SITE_OTHER): Payer: BLUE CROSS/BLUE SHIELD | Admitting: Physician Assistant

## 2017-09-18 VITALS — BP 130/80 | HR 78 | Temp 97.7°F | Ht 59.0 in | Wt 173.5 lb

## 2017-09-18 DIAGNOSIS — R3915 Urgency of urination: Secondary | ICD-10-CM | POA: Diagnosis not present

## 2017-09-18 LAB — POCT URINALYSIS DIPSTICK
Bilirubin, UA: NEGATIVE
Glucose, UA: NEGATIVE
Ketones, UA: NEGATIVE
NITRITE UA: NEGATIVE
UROBILINOGEN UA: 0.2 U/dL
pH, UA: 5.5 (ref 5.0–8.0)

## 2017-09-18 MED ORDER — CIPROFLOXACIN HCL 250 MG PO TABS: 250.0000 mg | ORAL_TABLET | Freq: Two times a day (BID) | ORAL | 0 refills | Status: DC

## 2017-09-18 NOTE — Progress Notes (Signed)
Tara Garner is a 62 y.o. female here for a new problem.  I acted as a Education administrator for [employer], PA-C Anselmo Pickler, LPN  History of Present Illness:   Chief Complaint  Patient presents with  . Urinary Urgency  . Urinary Frequency    Urinary Frequency   This is a new problem. The current episode started yesterday. The problem occurs every urination. The problem has been gradually worsening. The quality of the pain is described as aching (pressure). The pain is at a severity of 7/10 (pressure). There has been no fever. She is sexually active. There is no history of pyelonephritis. Associated symptoms include frequency and urgency. Pertinent negatives include no chills, hematuria, nausea, possible pregnancy or vomiting. Associated symptoms comments: Pain right flank area radiating to low mid back. She has tried nothing for the symptoms. The treatment provided no relief. Her past medical history is significant for recurrent UTIs. There is no history of kidney stones or a single kidney.   She has history of UTI, most recently treated in Nov 2018 with cipro.  Patient is about to go to [place] to visit her grandson's first birthday.  Past Medical History:  Diagnosis Date  . Acid reflux   . Asthma   . Cough due to ACE inhibitor   . Depression, recurrent ([place]) 09/16/2016  . Diabetes mellitus without complication ([place])   . DVT (deep venous thrombosis) ([place])   . Hypertension   . Morbid obesity ([place]) 09/16/2016  . Seasonal allergic rhinitis due to pollen 09/16/2016  . Sleep apnea   . Ulcerative colitis ([place])      Social History   Socioeconomic History  . Marital status: Married    Spouse name: Not on file  . Number of children: Not on file  . Years of education: Not on file  . Highest education level: Not on file  Occupational History  . Not on file  Social Needs  . Financial resource strain: Not on file  . Food insecurity:    Worry: Not on file    Inability: Not on file  .  Transportation needs:    Medical: Not on file    Non-medical: Not on file  Tobacco Use  . Smoking status: Never Smoker  . Smokeless tobacco: Never Used  Substance and Sexual Activity  . Alcohol use: No  . Drug use: No  . Sexual activity: Not on file  Lifestyle  . Physical activity:    Days per week: Not on file    Minutes per session: Not on file  . Stress: Not on file  Relationships  . Social connections:    Talks on phone: Not on file    Gets together: Not on file    Attends religious service: Not on file    Active member of club or organization: Not on file    Attends meetings of clubs or organizations: Not on file    Relationship status: Not on file  . Intimate partner violence:    Fear of current or ex partner: Not on file    Emotionally abused: Not on file    Physically abused: Not on file    Forced sexual activity: Not on file  Other Topics Concern  . Not on file  Social History Narrative  . Not on file    Past Surgical History:  Procedure Laterality Date  . ABDOMINAL HYSTERECTOMY    . BREAST BIOPSY Left    benign  . CATARACT EXTRACTION    .  CESAREAN SECTION    . KNEE SURGERY Left   . SHOULDER SURGERY Right     Family History  Problem Relation Age of Onset  . Diabetes Mother   . Dementia Mother   . Stroke Mother   . Coronary artery disease Mother   . Cancer Brother   . Diverticulitis Brother   . COPD Father   . Breast cancer Cousin     Allergies  Allergen Reactions  . Avelox [Moxifloxacin]     Cannot recall  . Benicar [Olmesartan]     Cannot recall  . Ceftin [Cefuroxime]     Cannot recall  . Codeine     Hallucinations  . Imuran [Azathioprine]     Instigates cough  . Indocin [Indomethacin]     Cannot recall reaction  . Methotrexate Derivatives     Instigates cough    Current Medications:   Current Outpatient Medications:  .  albuterol (PROVENTIL HFA;VENTOLIN HFA) 108 (90 Base) MCG/ACT inhaler, Inhale 2 puffs into the lungs every 6  (six) hours as needed for wheezing or shortness of breath., Disp: , Rfl:  .  aspirin EC 81 MG tablet, Take 81 mg by mouth daily., Disp: , Rfl:  .  blood glucose meter kit and supplies KIT, Dispense based on patient and insurance preference. Use up to four times daily as directed. (FOR ICD-9 250.00, 250.01)., Disp: 1 each, Rfl: 0 .  Blood Glucose Monitoring Suppl (FREESTYLE FREEDOM LITE) w/Device KIT, Use to test blood sugar daily, Disp: 1 each, Rfl: 1 .  buPROPion (WELLBUTRIN XL) 300 MG 24 hr tablet, TAKE 1 TABLET (300 MG TOTAL) DAILY BY MOUTH., Disp: 90 tablet, Rfl: 0 .  Calcium Carbonate-Vitamin D (CALCIUM 500/D PO), Take 500 mg by mouth daily., Disp: , Rfl:  .  cholecalciferol (VITAMIN D) 1000 units tablet, Take 4,000 Units by mouth daily., Disp: , Rfl:  .  Cranberry 125 MG TABS, Take 1 tablet by mouth daily., Disp: , Rfl:  .  Diclofenac Sodium (PENNSAID) 2 % SOLN, Small amount to area, Disp: 1 Bottle, Rfl: 0 .  fluticasone (FLONASE) 50 MCG/ACT nasal spray, Place into both nostrils daily as needed for allergies or rhinitis., Disp: , Rfl:  .  Fluticasone-Salmeterol (ADVAIR) 250-50 MCG/DOSE AEPB, Inhale 1 puff into the lungs 2 (two) times daily as needed., Disp: , Rfl:  .  gabapentin (NEURONTIN) 100 MG capsule, Take 1 capsule (100 mg total) by mouth 3 (three) times daily., Disp: 30 capsule, Rfl: 0 .  loperamide (ANTI-DIARRHEAL) 2 MG tablet, Take 2 mg as needed by mouth for diarrhea or loose stools., Disp: , Rfl:  .  metoprolol succinate (TOPROL-XL) 100 MG 24 hr tablet, TAKE 1 TABLET (100 MG TOTAL) BY MOUTH DAILY. TAKE WITH OR IMMEDIATELY FOLLOWING A MEAL., Disp: 90 tablet, Rfl: 1 .  montelukast (SINGULAIR) 10 MG tablet, Take 1 tablet (10 mg total) by mouth at bedtime., Disp: 90 tablet, Rfl: 1 .  Multiple Vitamin (MULTIVITAMIN) tablet, Take 1 tablet daily by mouth., Disp: , Rfl:  .  nystatin-triamcinolone ointment (MYCOLOG), Apply 1 application topically 2 (two) times daily., Disp: 60 g, Rfl: 0 .   VICTOZA 18 MG/3ML SOPN, Inject 0.2 mLs (1.2 mg total) into the skin daily. Inject once daily at the same time (Patient taking differently: Inject 1.2 mg into the skin daily. Inject once daily at the same time), Disp: 9 pen, Rfl: 1 .  ciprofloxacin (CIPRO) 250 MG tablet, Take 1 tablet (250 mg total) by mouth 2 (two) times  daily., Disp: 14 tablet, Rfl: 0 .  Mesalamine 800 MG TBEC, Take 3 tablets (2,400 mg total) by mouth 2 (two) times daily., Disp: 540 tablet, Rfl: 3 .  pantoprazole (PROTONIX) 40 MG tablet, Take 1 tablet (40 mg total) by mouth 2 (two) times daily., Disp: 180 tablet, Rfl: 3 .  ranitidine (ZANTAC) 150 MG tablet, TAKE 2 TABLETS (300 MG TOTAL) AT BEDTIME BY MOUTH., Disp: 60 tablet, Rfl: 3   Review of Systems:   Review of Systems  Constitutional: Negative for chills.  Gastrointestinal: Negative for nausea and vomiting.  Genitourinary: Positive for frequency and urgency. Negative for hematuria.    Vitals:   Vitals:   09/18/17 1029  BP: 130/80  Pulse: 78  Temp: 97.7 F (36.5 C)  TempSrc: Oral  SpO2: 97%  Weight: 173 lb 8 oz (78.7 kg)  Height: _0  (1.499 m)     Body mass index is 35.04 kg/m.  Physical Exam:   Physical Exam  Constitutional: She appears well-developed. She is cooperative.  Non-toxic appearance. She does not have a sickly appearance. She does not appear ill. No distress.  Cardiovascular: Normal rate, regular rhythm, S1 normal, S2 normal, normal heart sounds and normal pulses.  No LE edema  Pulmonary/Chest: Effort normal and breath sounds normal.  Abdominal: Normal appearance and bowel sounds are normal. There is tenderness in the suprapubic area. There is CVA tenderness (slight R side).  Neurological: She is alert. GCS eye subscore is 4. GCS verbal subscore is 5. GCS motor subscore is 6.  Skin: Skin is warm, dry and intact.  Psychiatric: She has a normal mood and affect. Her speech is normal and behavior is normal.  Nursing note and vitals  reviewed.  Results for orders placed or performed in visit on 09/18/17  POCT urinalysis dipstick  Result Value Ref Range   Color, UA Yellow    Clarity, UA Cloudy    Glucose, UA Negative    Bilirubin, UA Negative    Ketones, UA Negative    Spec Grav, UA >=1.030 (A) 1.010 - 1.025   Blood, UA Moderate    pH, UA 5.5 5.0 - 8.0   Protein, UA Small    Urobilinogen, UA 0.2 0.2 or 1.0 E.U./dL   Nitrite, UA Negative    Leukocytes, UA Large (3+) (A) Negative   Appearance     Odor      Assessment and Plan:    [place] was seen today for urinary urgency and urinary frequency.  Diagnoses and all orders for this visit:  Urgency of urination -     POCT urinalysis dipstick  Other orders -     ciprofloxacin (CIPRO) 250 MG tablet; Take 1 tablet (250 mg total) by mouth 2 (two) times daily.   Given history and symptoms, +leuks on UA, will treat with ciprofloxacin per orders. She states that she is unable to provide additional urine for [place] to run a urine culture. She does have a moxifloxacin allergy however she states that she can tolerate cipro. Follow-up if symptoms worsen or persist.  . Reviewed expectations re: course of current medical issues. . Discussed self-management of symptoms. . Outlined signs and symptoms indicating need for more acute intervention. . Patient verbalized understanding and all questions were answered. . See orders for this visit as documented in the electronic medical record. . Patient received an After-Visit Summary.  CMA or LPN served as scribe during this visit. History, Physical, and Plan performed by medical provider. Documentation and orders reviewed  and attested to.  Inda Coke, PA-C

## 2017-09-18 NOTE — Patient Instructions (Signed)
It was great to see you!

## 2017-09-24 ENCOUNTER — Other Ambulatory Visit: Payer: Self-pay | Admitting: Family Medicine

## 2017-09-30 ENCOUNTER — Telehealth: Payer: Self-pay

## 2017-09-30 NOTE — Telephone Encounter (Signed)
The pt has been scheduled for previsit and colon.  She has been notified.

## 2017-09-30 NOTE — Telephone Encounter (Signed)
-----   Message from Jeoffrey Massed, RN sent at 04/02/2017 11:34 AM EST ----- You will be set up for a colonoscopy for high risk colon screening (chronic UC), prefers after holidays. Please return to see Dr. Ardis Hughs in 6 months.

## 2017-10-03 ENCOUNTER — Other Ambulatory Visit: Payer: Self-pay | Admitting: Gastroenterology

## 2017-10-03 ENCOUNTER — Other Ambulatory Visit: Payer: Self-pay | Admitting: Family Medicine

## 2017-10-03 DIAGNOSIS — E1142 Type 2 diabetes mellitus with diabetic polyneuropathy: Secondary | ICD-10-CM

## 2017-10-03 NOTE — Telephone Encounter (Signed)
Copied from [place] [id number]. Topic: Quick Communication - Rx Refill/Question >> Oct 03, 2017  9:27 AM Ether Griffins B wrote: Medication: gabapentin (NEURONTIN) 100 MG capsule.        montelukast (SINGULAIR) 10 MG tablet  Has the patient contacted their pharmacy? Yes.   (Agent: If no, request that the patient contact the pharmacy for the refill.) Preferred Pharmacy (with phone number or street name): [id number] [place] Please be advised that RX refills may take up to 3 business days. We ask that you follow-up with your pharmacy.

## 2017-10-04 MED ORDER — GABAPENTIN 100 MG PO CAPS: 100.0000 mg | ORAL_CAPSULE | Freq: Three times a day (TID) | ORAL | 0 refills | Status: DC

## 2017-10-04 MED ORDER — MONTELUKAST SODIUM 10 MG PO TABS: 10.0000 mg | ORAL_TABLET | Freq: Every day | ORAL | 1 refills | Status: DC

## 2017-10-04 NOTE — Telephone Encounter (Signed)
Singular refilled per protocol- addressed and refilled per visit 3/19  Rx refill request: gabapentin 100 mg - for provider review- # and sig - may need Rx revision for refill  LOV: 09/16/17  PCP: [place]: verified

## 2017-10-04 NOTE — Telephone Encounter (Signed)
See note

## 2017-10-04 NOTE — Telephone Encounter (Signed)
Please advise on refill.

## 2017-10-22 ENCOUNTER — Ambulatory Visit
Admission: RE | Admit: 2017-10-22 | Discharge: 2017-10-22 | Disposition: A | Discharge status: Home / Self Care | Payer: BLUE CROSS/BLUE SHIELD | Source: Ambulatory Visit | Attending: Family Medicine | Admitting: Family Medicine

## 2017-10-22 ENCOUNTER — Other Ambulatory Visit: Payer: Self-pay | Admitting: Family Medicine

## 2017-10-22 DIAGNOSIS — Z1231 Encounter for screening mammogram for malignant neoplasm of breast: Secondary | ICD-10-CM | POA: Diagnosis not present

## 2017-10-22 DIAGNOSIS — E1142 Type 2 diabetes mellitus with diabetic polyneuropathy: Secondary | ICD-10-CM

## 2017-10-22 MED ORDER — GABAPENTIN 100 MG PO CAPS: 100.0000 mg | ORAL_CAPSULE | Freq: Three times a day (TID) | ORAL | 1 refills | Status: DC

## 2017-10-22 NOTE — Telephone Encounter (Signed)
See note

## 2017-10-22 NOTE — Telephone Encounter (Signed)
Patient needs her Rx corrected- she was only given #30 which will not be enough for her dosing.

## 2017-10-22 NOTE — Telephone Encounter (Signed)
Copied from [place] [id number]. Topic: Quick Communication - See Telephone Encounter >> Oct 22, 2017 10:01 AM Conception Chancy, NT wrote: CRM for notification. See Telephone encounter for: 10/22/17.  Patient is requesting a refill on gabapentin (NEURONTIN) 100 MG capsule. She states she needs 90 tablets. She states she takes 1 capsule 3 times a day.   [id number]: [phone]: [phone]

## 2017-10-22 NOTE — Telephone Encounter (Signed)
New prescription sent to the pharmacy.

## 2017-10-30 ENCOUNTER — Other Ambulatory Visit: Payer: Self-pay

## 2017-10-30 ENCOUNTER — Ambulatory Visit (AMBULATORY_SURGERY_CENTER): Payer: BLUE CROSS/BLUE SHIELD | Admitting: *Deleted

## 2017-10-30 VITALS — Ht 59.0 in | Wt 175.0 lb

## 2017-10-30 DIAGNOSIS — Z800 Family history of malignant neoplasm of digestive organs: Secondary | ICD-10-CM

## 2017-10-30 DIAGNOSIS — Z86010 Personal history of colonic polyps: Secondary | ICD-10-CM

## 2017-10-30 DIAGNOSIS — K5010 Crohn's disease of large intestine without complications: Secondary | ICD-10-CM

## 2017-10-30 MED ORDER — NA SULFATE-K SULFATE-MG SULF 17.5-3.13-1.6 GM/177ML PO SOLN: ORAL | 0 refills | Status: DC

## 2017-10-30 NOTE — Progress Notes (Signed)
Patient denies any allergies to eggs or soy. Patient denies any problems with anesthesia/sedation. Patient denies any oxygen use at home. Patient denies taking any diet/weight loss medications or blood thinners. EMMI education offered, pt declined. Patient request not to drink the Gol[place]. Suprep coupon printed and given to pt in [place].

## 2017-10-31 ENCOUNTER — Encounter: Payer: Self-pay | Admitting: Gastroenterology

## 2017-11-04 ENCOUNTER — Encounter: Payer: Self-pay | Admitting: Gastroenterology

## 2017-11-04 ENCOUNTER — Ambulatory Visit (AMBULATORY_SURGERY_CENTER): Payer: BLUE CROSS/BLUE SHIELD | Admitting: Gastroenterology

## 2017-11-04 ENCOUNTER — Other Ambulatory Visit: Payer: Self-pay

## 2017-11-04 VITALS — BP 120/77 | HR 73 | Temp 98.0°F | Resp 17 | Ht 59.0 in | Wt 175.0 lb

## 2017-11-04 DIAGNOSIS — Z1211 Encounter for screening for malignant neoplasm of colon: Secondary | ICD-10-CM | POA: Diagnosis not present

## 2017-11-04 DIAGNOSIS — K51919 Ulcerative colitis, unspecified with unspecified complications: Secondary | ICD-10-CM

## 2017-11-04 DIAGNOSIS — K51819 Other ulcerative colitis with unspecified complications: Secondary | ICD-10-CM | POA: Diagnosis not present

## 2017-11-04 DIAGNOSIS — Z86010 Personal history of colonic polyps: Secondary | ICD-10-CM

## 2017-11-04 MED ORDER — SODIUM CHLORIDE 0.9 % IV SOLN: 500.0000 mL | Freq: Once | INTRAVENOUS | Status: DC

## 2017-11-04 NOTE — Progress Notes (Signed)
Pt's states no medical or surgical changes since previsit or office visit. 

## 2017-11-04 NOTE — Progress Notes (Signed)
Called to room to assist during endoscopic procedure.  Patient ID and intended procedure confirmed with present staff. Received instructions for my participation in the procedure from the performing physician.  

## 2017-11-04 NOTE — Progress Notes (Signed)
Report given to PACU, vss 

## 2017-11-04 NOTE — Op Note (Signed)
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 High risk                            screening. Medicines:                Monitored Anesthesia Care Procedure:                Pre-Anesthesia Assessment:                           - Prior to the procedure, a History and Physical                            was performed, and patient medications and                            allergies were reviewed. The patient's tolerance of                            previous anesthesia was also reviewed. The risks                            and benefits of the procedure and the sedation                            options and risks were discussed with the patient.                            All questions were answered, and informed consent                            was obtained. Prior Anticoagulants: The patient has                            taken no previous anticoagulant or antiplatelet                            agents. ASA Grade Assessment: III - A patient with                            severe systemic disease. After reviewing the risks                            and benefits, the patient was deemed in  satisfactory condition to undergo the procedure.                           After obtaining informed consent, the colonoscope                            was passed under direct vision. Throughout the                            procedure, the patient's blood pressure, pulse, and                            oxygen saturations were monitored continuously. The                            Colonoscope was introduced through the anus and                             advanced to the the terminal ileum. The colonoscopy                            was performed without difficulty. The patient                            tolerated the procedure well. The quality of the                            bowel preparation was good. The terminal ileum,                            ileocecal valve, appendiceal orifice, and rectum                            were photographed. Scope In: 2:42:24 PM Scope Out: 2:57:17 PM Scope Withdrawal Time: 0 hours 10 minutes 45 seconds  Total Procedure Duration: 0 hours 14 minutes 53 seconds  Findings:                 The terminal ileum appeared normal.                           The colon mucosa was slightly granular throughout.                            Multiple biopsies taken (left colon jar 1, right                            colon jar 2).                           Two biopsy sites bled more than is usual but both                            stopped oozing without need for intervention. Complications:  No immediate complications. Estimated blood loss:                            None. Estimated Blood Loss:     Estimated blood loss: none. Impression:               - The examined portion of the ileum was normal.                           - The colon mucosa was slightly granular. Multiple                            biopsies taken. Recommendation:           - Patient has a contact number available for                            emergencies. The signs and symptoms of potential                            delayed complications were discussed with the                            patient. Return to normal activities tomorrow.                            Written discharge instructions were provided to the                            patient.                           - Resume previous diet.                           - Continue present medications.                           - Await final path  reulsts. Milus Banister, MD 11/04/2017 3:03:05 PM This report has been signed electronically.

## 2017-11-04 NOTE — Patient Instructions (Signed)
*  Handouts given on diverticulosis and hemorrhoids.  YOU HAD AN ENDOSCOPIC PROCEDURE TODAY AT [facility]:   Refer to the procedure report that was given to you for any specific questions about what was found during the examination.  If the procedure report does not answer your questions, please call your gastroenterologist to clarify.  If you requested that your care partner not be given the details of your procedure findings, then the procedure report has been included in a sealed envelope for you to review at your convenience later.  YOU SHOULD EXPECT: Some feelings of bloating in the abdomen. Passage of more gas than usual.  Walking can help get rid of the air that was put into your GI tract during the procedure and reduce the bloating. If you had a lower endoscopy (such as a colonoscopy or flexible sigmoidoscopy) you may notice spotting of blood in your stool or on the toilet paper. If you underwent a bowel prep for your procedure, you may not have a normal bowel movement for a few days.  Please Note:  You might notice some irritation and congestion in your nose or some drainage.  This is from the oxygen used during your procedure.  There is no need for concern and it should clear up in a day or so.  SYMPTOMS TO REPORT IMMEDIATELY:   Following lower endoscopy (colonoscopy or flexible sigmoidoscopy):  Excessive amounts of blood in the stool  Significant tenderness or worsening of abdominal pains  Swelling of the abdomen that is new, acute  Fever of 100F or higher   For urgent or emergent issues, a gastroenterologist can be reached at any hour by calling [phone].   DIET:  We do recommend a small meal at first, but then you may proceed to your regular diet.  Drink plenty of fluids but you should avoid alcoholic beverages for 24 hours.  ACTIVITY:  You should plan to take it easy for the rest of today and you should NOT DRIVE or use heavy machinery until tomorrow  (because of the sedation medicines used during the test).    FOLLOW UP: Our staff will call the number listed on your records the next business day following your procedure to check on you and address any questions or concerns that you may have regarding the information given to you following your procedure. If we do not reach you, we will leave a message.  However, if you are feeling well and you are not experiencing any problems, there is no need to return our call.  We will assume that you have returned to your regular daily activities without incident.  If any biopsies were taken you will be contacted by phone or by letter within the next 1-3 weeks.  Please call us at [phone] if you have not heard about the biopsies in 3 weeks.    SIGNATURES/CONFIDENTIALITY: You and/or your care partner have signed paperwork which will be entered into your electronic medical record.  These signatures attest to the fact that that the information above on your After Visit Summary has been reviewed and is understood.  Full responsibility of the confidentiality of this discharge information lies with you and/or your care-partner.

## 2017-11-05 ENCOUNTER — Other Ambulatory Visit: Payer: Self-pay | Admitting: Gastroenterology

## 2017-11-05 ENCOUNTER — Telehealth: Payer: Self-pay | Admitting: *Deleted

## 2017-11-05 NOTE — Telephone Encounter (Signed)
No answer, message left for the patient. 

## 2017-11-13 ENCOUNTER — Other Ambulatory Visit: Payer: Self-pay | Admitting: Family Medicine

## 2017-11-13 DIAGNOSIS — E1142 Type 2 diabetes mellitus with diabetic polyneuropathy: Secondary | ICD-10-CM

## 2017-11-14 ENCOUNTER — Telehealth: Payer: Self-pay

## 2017-11-14 NOTE — Telephone Encounter (Signed)
-----   Message from Hulan Saas, RN sent at 11/12/2017 12:50 PM EDT ----- 6 month f/u with Ardis Hughs ( hx UC)

## 2017-11-14 NOTE — Telephone Encounter (Signed)
Pt scheduled to see Dr. Ardis Hughs 01/21/18@3pm , letter sent.

## 2017-12-23 ENCOUNTER — Encounter: Payer: Self-pay | Admitting: Family Medicine

## 2017-12-23 ENCOUNTER — Ambulatory Visit (INDEPENDENT_AMBULATORY_CARE_PROVIDER_SITE_OTHER): Payer: BLUE CROSS/BLUE SHIELD | Admitting: Family Medicine

## 2017-12-23 VITALS — BP 120/62 | HR 85 | Temp 98.0°F | Ht 59.0 in | Wt 178.8 lb

## 2017-12-23 DIAGNOSIS — Z1159 Encounter for screening for other viral diseases: Secondary | ICD-10-CM

## 2017-12-23 DIAGNOSIS — N183 Chronic kidney disease, stage 3 (moderate): Secondary | ICD-10-CM

## 2017-12-23 DIAGNOSIS — Z114 Encounter for screening for human immunodeficiency virus [HIV]: Secondary | ICD-10-CM

## 2017-12-23 DIAGNOSIS — R05 Cough: Secondary | ICD-10-CM

## 2017-12-23 DIAGNOSIS — E1122 Type 2 diabetes mellitus with diabetic chronic kidney disease: Secondary | ICD-10-CM

## 2017-12-23 DIAGNOSIS — Z9189 Other specified personal risk factors, not elsewhere classified: Secondary | ICD-10-CM | POA: Diagnosis not present

## 2017-12-23 DIAGNOSIS — I10 Essential (primary) hypertension: Secondary | ICD-10-CM

## 2017-12-23 DIAGNOSIS — E1159 Type 2 diabetes mellitus with other circulatory complications: Secondary | ICD-10-CM

## 2017-12-23 DIAGNOSIS — R059 Cough, unspecified: Secondary | ICD-10-CM

## 2017-12-23 LAB — COMPREHENSIVE METABOLIC PANEL
ALT: 21 U/L (ref 0–35)
AST: 23 U/L (ref 0–37)
Albumin: 3.8 g/dL (ref 3.5–5.2)
Alkaline Phosphatase: 73 U/L (ref 39–117)
BUN: 26 mg/dL — ABNORMAL HIGH (ref 6–23)
CO2: 26 mEq/L (ref 19–32)
Calcium: 9 mg/dL (ref 8.4–10.5)
Chloride: 106 mEq/L (ref 96–112)
Creatinine, Ser: 1.15 mg/dL (ref 0.40–1.20)
GFR: 50.61 mL/min — ABNORMAL LOW (ref >60.00)
Glucose, Bld: 137 mg/dL — ABNORMAL HIGH (ref 70–99)
Potassium: 4.1 mEq/L (ref 3.5–5.1)
Sodium: 141 mEq/L (ref 135–145)
Total Bilirubin: 0.3 mg/dL (ref 0.2–1.2)
Total Protein: 6.7 g/dL (ref 6.0–8.3)

## 2017-12-23 LAB — CBC WITH DIFFERENTIAL/PLATELET
Basophils Absolute: 0.1 10*3/uL (ref 0.0–0.1)
Basophils Relative: 1.3 % (ref 0.0–3.0)
Eosinophils Absolute: 0.4 10*3/uL (ref 0.0–0.7)
Eosinophils Relative: 5.3 % — ABNORMAL HIGH (ref 0.0–5.0)
HCT: 40.6 % (ref 36.0–46.0)
Hemoglobin: 13.5 g/dL (ref 12.0–15.0)
Lymphocytes Relative: 33.3 % (ref 12.0–46.0)
Lymphs Abs: 2.8 10*3/uL (ref 0.7–4.0)
MCHC: 33.3 g/dL (ref 30.0–36.0)
MCV: 90.2 fl (ref 78.0–100.0)
Monocytes Absolute: 0.5 10*3/uL (ref 0.1–1.0)
Monocytes Relative: 5.9 % (ref 3.0–12.0)
Neutro Abs: 4.5 10*3/uL (ref 1.4–7.7)
Neutrophils Relative %: 54.2 % (ref 43.0–77.0)
Platelets: 317 10*3/uL (ref 150.0–400.0)
RBC: 4.5 Mil/uL (ref 3.87–5.11)
RDW: 13.8 % (ref 11.5–15.5)
WBC: 8.4 10*3/uL (ref 4.0–10.5)

## 2017-12-23 LAB — VITAMIN B12: Vitamin B-12: 531 pg/mL (ref 211–911)

## 2017-12-23 LAB — MICROALBUMIN / CREATININE URINE RATIO
Creatinine,U: 77.3 mg/dL
Microalb Creat Ratio: 0.9 mg/g (ref 0.0–30.0)
Microalb, Ur: <0.7 mg/dL (ref 0.0–1.9)

## 2017-12-23 LAB — HEMOGLOBIN A1C: Hgb A1c MFr Bld: 6.3 % (ref 4.6–6.5)

## 2017-12-23 MED ORDER — DOXYCYCLINE HYCLATE 100 MG PO TABS: 100.0000 mg | ORAL_TABLET | Freq: Two times a day (BID) | ORAL | 0 refills | Status: DC

## 2017-12-23 MED ORDER — FLUTICASONE PROPIONATE HFA 110 MCG/ACT IN AERO: 2.0000 | INHALATION_SPRAY | Freq: Two times a day (BID) | RESPIRATORY_TRACT | 12 refills | Status: DC

## 2017-12-23 MED ORDER — ALBUTEROL SULFATE HFA 108 (90 BASE) MCG/ACT IN AERS: 2.0000 | INHALATION_SPRAY | Freq: Four times a day (QID) | RESPIRATORY_TRACT | 0 refills | Status: DC | PRN

## 2017-12-23 NOTE — Progress Notes (Signed)
Tara Garner is a 62 y.o. female here for an acute visit.  History of Present Illness:   Tara Garner, CMA acting as scribe for Dr. Briscoe Deutscher.   HPI: Patient in office for evaluation for cough. Started with sore throat last week. Cough started about four days ago. Productive with green sputum. Not tried anything over the counter. No fever but she has had chills.   PMHx, SurgHx, SocialHx, Medications, and Allergies were reviewed in the Visit Navigator and updated as appropriate.  Current Medications:   Current Outpatient Medications:  .  aspirin EC 81 MG tablet, Take 81 mg by mouth daily., Disp: , Rfl:  .  buPROPion (WELLBUTRIN XL) 300 MG 24 hr tablet, TAKE 1 TABLET (300 MG TOTAL) DAILY BY MOUTH., Disp: 90 tablet, Rfl: 0 .  Calcium Carbonate-Vitamin D (CALCIUM 500/D PO), Take 500 mg by mouth daily., Disp: , Rfl:  .  cholecalciferol (VITAMIN D) 1000 units tablet, Take 4,000 Units by mouth daily., Disp: , Rfl:  .  Cranberry 125 MG TABS, Take 1 tablet by mouth daily., Disp: , Rfl:  .  gabapentin (NEURONTIN) 100 MG capsule, TAKE 1 CAPSULE BY MOUTH THREE TIMES A DAY, Disp: 270 capsule, Rfl: 0 .  loperamide (ANTI-DIARRHEAL) 2 MG tablet, Take 2 mg as needed by mouth for diarrhea or loose stools., Disp: , Rfl:  .  Mesalamine 800 MG TBEC, TAKE 3 TABLETS (2,400 MG TOTAL) BY MOUTH 2 (TWO) TIMES DAILY., Disp: 540 tablet, Rfl: 3 .  metoprolol succinate (TOPROL-XL) 100 MG 24 hr tablet, TAKE 1 TABLET (100 MG TOTAL) BY MOUTH DAILY. TAKE WITH OR IMMEDIATELY FOLLOWING A MEAL., Disp: 90 tablet, Rfl: 1 .  montelukast (SINGULAIR) 10 MG tablet, Take 1 tablet (10 mg total) by mouth at bedtime., Disp: 90 tablet, Rfl: 1 .  Multiple Vitamin (MULTIVITAMIN) tablet, Take 1 tablet daily by mouth., Disp: , Rfl:  .  pantoprazole (PROTONIX) 40 MG tablet, Take 1 tablet (40 mg total) by mouth 2 (two) times daily., Disp: 180 tablet, Rfl: 3 .  ranitidine (ZANTAC) 150 MG tablet, TAKE 2 TABLETS (300 MG TOTAL) AT  BEDTIME BY MOUTH., Disp: 60 tablet, Rfl: 3 .  VICTOZA 18 MG/3ML SOPN, Inject 0.2 mLs (1.2 mg total) into the skin daily. Inject once daily at the same time (Patient taking differently: Inject 1.2 mg into the skin daily. Inject once daily at the same time), Disp: 9 pen, Rfl: 1  Allergies  Allergen Reactions  . Avelox [Moxifloxacin]     Cannot recall  . Benicar [Olmesartan]     Cannot recall  . Ceftin [Cefuroxime]     Cannot recall  . Codeine     Hallucinations  . Imuran [Azathioprine]     Instigates cough  . Indocin [Indomethacin]     Cannot recall reaction  . Methotrexate Derivatives     Instigates cough   Review of Systems:   Pertinent items are noted in the HPI. Otherwise, ROS is negative.  Vitals:   Vitals:   12/23/17 1434  BP: 120/62  Pulse: 85  Temp: 98 F (36.7 C)  TempSrc: Oral  SpO2: 95%  Weight: 178 lb 12.8 oz (81.1 kg)  Height: 4' 11"  (1.499 m)     Body mass index is 36.11 kg/m.  Physical Exam:   Physical Exam  Constitutional: She appears well-nourished.  HENT:  Head: Normocephalic and atraumatic.  Eyes: Pupils are equal, round, and reactive to light. EOM are normal.  Neck: Normal range of motion. Neck supple.  Cardiovascular: Normal rate, regular rhythm, normal heart sounds and intact distal pulses.  Pulmonary/Chest: Effort normal.  Abdominal: Soft.  Skin: Skin is warm.  Psychiatric: She has a normal mood and affect. Her behavior is normal.  Nursing note and vitals reviewed.   Assessment and Plan:   Tara Garner was seen today for sore throat and cough.  Diagnoses and all orders for this visit:  Cough -     doxycycline (VIBRA-TABS) 100 MG tablet; Take 1 tablet (100 mg total) by mouth 2 (two) times daily. -     albuterol (PROVENTIL HFA;VENTOLIN HFA) 108 (90 Base) MCG/ACT inhaler; Inhale 2 puffs into the lungs every 6 (six) hours as needed for wheezing or shortness of breath. -     fluticasone (FLOVENT HFA) 110 MCG/ACT inhaler; Inhale 2 puffs into  the lungs 2 (two) times daily.  Encounter for hepatitis C virus screening test for high risk patient -     Hepatitis C antibody  Type 2 diabetes mellitus with stage 3 chronic kidney disease, without long-term current use of insulin ([place]) -     Ambulatory referral to Ophthalmology -     CBC with Differential/Platelet -     Comprehensive metabolic panel -     Hemoglobin A1c -     Vitamin B12 -     Microalbumin / creatinine urine ratio  Essential hypertension  Screening for HIV (human immunodeficiency virus) -     HIV antibody    . Reviewed expectations re: course of current medical issues. . Discussed self-management of symptoms. . Outlined signs and symptoms indicating need for more acute intervention. . Patient verbalized understanding and all questions were answered. Marland Kitchen Health Maintenance issues including appropriate healthy diet, exercise, and smoking avoidance were discussed with patient. . See orders for this visit as documented in the electronic medical record. . Patient received an After Visit Summary.  CMA served as Education administrator during this visit. History, Physical, and Plan performed by medical provider. The above documentation has been reviewed and is accurate and complete. Briscoe Deutscher, D.O.  Briscoe Deutscher, DO [place] 12/26/2017

## 2017-12-24 LAB — HEPATITIS C ANTIBODY
Hepatitis C Ab: NONREACTIVE
SIGNAL TO CUT-OFF: 0.02 (ref <1.00)

## 2017-12-24 LAB — HIV ANTIBODY (ROUTINE TESTING W REFLEX): HIV 1&2 Ab, 4th Generation: NONREACTIVE

## 2017-12-27 ENCOUNTER — Telehealth: Payer: Self-pay | Admitting: Family Medicine

## 2017-12-27 NOTE — Telephone Encounter (Signed)
Called patient she is still having productive (green) cough. She has had chills but no fever. She was not able to get peek flow over 200 today. She is having increased SOB. She is taking the doxycycline bid and two inhalers as directed. She has increased her fluids and has not had much change in her appetite.

## 2017-12-27 NOTE — Telephone Encounter (Signed)
See note

## 2017-12-27 NOTE — Telephone Encounter (Signed)
Copied from [place] [id number]. Topic: Quick Communication - See Telephone Encounter >> Dec 27, 2017 10:44 AM Rutherford Nail, NT wrote: CRM for notification. See Telephone encounter for: 12/27/17. Patient calling and states that she is not feeling better from her last visit where she was diagnosed with bronchitis. States that she has been on doxycycline (VIBRA-TABS) 100 MG tablet since Monday 12/23/17. Still has not seen any improvement and does not feel better. Would like to know what Dr Juleen China would like to do to get her feeling better? CB#: [phone]

## 2017-12-28 ENCOUNTER — Encounter: Payer: Self-pay | Admitting: Family Medicine

## 2017-12-28 MED ORDER — PREDNISONE 5 MG PO TABS: ORAL_TABLET | ORAL | 0 refills | Status: DC

## 2017-12-28 NOTE — Telephone Encounter (Signed)
I called in prednisone and sent an email to the patient on 12/28/17. Check in on Monday. To see how she is feeling.

## 2017-12-30 NOTE — Telephone Encounter (Signed)
Cough has improved a little and color is turning more yellow. She started prednisone over the weekend.

## 2018-01-01 ENCOUNTER — Other Ambulatory Visit: Payer: Self-pay | Admitting: Family Medicine

## 2018-01-06 ENCOUNTER — Other Ambulatory Visit (INDEPENDENT_AMBULATORY_CARE_PROVIDER_SITE_OTHER): Payer: BLUE CROSS/BLUE SHIELD

## 2018-01-06 DIAGNOSIS — E1169 Type 2 diabetes mellitus with other specified complication: Secondary | ICD-10-CM

## 2018-01-06 DIAGNOSIS — E669 Obesity, unspecified: Secondary | ICD-10-CM | POA: Diagnosis not present

## 2018-01-06 LAB — MICROALBUMIN / CREATININE URINE RATIO
CREATININE, U: 135.1 mg/dL
MICROALB/CREAT RATIO: 0.5 mg/g (ref 0.0–30.0)

## 2018-01-06 LAB — BASIC METABOLIC PANEL
BUN: 23 mg/dL (ref 6–23)
CALCIUM: 9.5 mg/dL (ref 8.4–10.5)
CO2: 30 mEq/L (ref 19–32)
CREATININE: 1.35 mg/dL — AB (ref 0.40–1.20)
Chloride: 106 mEq/L (ref 96–112)
GFR: 42.06 mL/min — ABNORMAL LOW (ref >60.00)
Glucose, Bld: 132 mg/dL — ABNORMAL HIGH (ref 70–99)
Potassium: 4.4 mEq/L (ref 3.5–5.1)
Sodium: 139 mEq/L (ref 135–145)

## 2018-01-06 LAB — HEMOGLOBIN A1C: HEMOGLOBIN A1C: 6.4 % (ref 4.6–6.5)

## 2018-01-09 ENCOUNTER — Ambulatory Visit (INDEPENDENT_AMBULATORY_CARE_PROVIDER_SITE_OTHER): Payer: BLUE CROSS/BLUE SHIELD | Admitting: Endocrinology

## 2018-01-09 ENCOUNTER — Encounter: Payer: Self-pay | Admitting: Endocrinology

## 2018-01-09 ENCOUNTER — Telehealth: Payer: Self-pay | Admitting: Endocrinology

## 2018-01-09 VITALS — BP 110/60 | HR 81 | Ht 59.0 in | Wt 178.2 lb

## 2018-01-09 DIAGNOSIS — E1169 Type 2 diabetes mellitus with other specified complication: Secondary | ICD-10-CM | POA: Diagnosis not present

## 2018-01-09 DIAGNOSIS — E669 Obesity, unspecified: Secondary | ICD-10-CM | POA: Diagnosis not present

## 2018-01-09 MED ORDER — INSULIN PEN NEEDLE 32G X 4 MM MISC: 0 refills | Status: DC

## 2018-01-09 NOTE — Progress Notes (Signed)
Patient ID: Tara Garner, female   DOB: 1955/06/14, 62 y.o.   MRN: [id number]            Reason for Appointment:  Follow-up for Type 2 Diabetes  Referring physician: Briscoe Deutscher   History of Present Illness:          Date of diagnosis of type 2 diabetes mellitus:   2006       Background history:   She was probably treated with metformin initially, she does not remember the details Started insulin in 2007 presumably for poor control She thinks that until a couple of years ago she was followed by an endocrinologist in [place] and then PCP in [place] However A1c would usually be around 8% Also in 2015 she was hospitalized for unknown reasons and developed encephalopathy and diabetic ketoacidosis, was in a nursing home for about a month subsequently and was continued on insulin About a year ago she lost about 70 pounds with cutting back on portions   On her initial consultation she was on basal bolus insulin regimen with tendency to hypoglycemia especially in early morning and after breakfast  Recent history:   Non-insulin hypoglycemic drugs the patient is taking are: Victoza 0.9 mg daily  Her A1c has been very consistent around 6% and is the same now  Current management, blood sugar patterns and problems identified:  She has checked her blood sugar only 5 times in the last month  Again despite reminders does not check her sugars after meals  Her weight is slightly higher  She think this may be partly from being on vacation a couple of times  For various reasons she does not exercise even though she thinks that she was walking more when she was on a cruise  Blood sugars are fairly good at home  She is now been having excessive suppression of her appetite with current dose of Victoza       Side effects from medications have been: None  Compliance with the medical regimen: Good Hypoglycemia: None recently  Glucose monitoring:  done <1 times a day          Glucometer:  Freestyle .       Blood Glucose readings by time of day and averages from meter download:  Fasting blood sugars range 95-117  Self-care: The diet that the patient has been following is: tries to limit Portions .     Meal times are:  Breakfast is at 9-10 am     Typical meal intake: Breakfast is  variable, sometimes English muffin/bagel, juice.  Lunch lite, fruit; dinner is meat and potato, snacks: Apple cheese crackers or cookies              Dietician visit, most recent: Around diagnosis time CDE visit: 6/18               Exercise: a little walking  Weight history:  maximum weight 260, most of her weight loss was in early 2017   Wt Readings from Last 3 Encounters:  01/09/18 178 lb 3.2 oz (80.8 kg)  12/23/17 178 lb 12.8 oz (81.1 kg)  11/04/17 175 lb (79.4 kg)    Glycemic control:   Lab Results  Component Value Date   HGBA1C 6.4 01/06/2018   HGBA1C 6.3 12/23/2017   HGBA1C 6.0 09/04/2017   Lab Results  Component Value Date   MICROALBUR <0.7 01/06/2018   LDLCALC 81 09/04/2017   CREATININE 1.35 (H) 01/06/2018   Lab Results  Component Value Date   MICRALBCREAT 0.5 01/06/2018    Lab Results  Component Value Date   FRUCTOSAMINE 314 (H) 12/04/2016      Allergies as of 01/09/2018      Reactions   Avelox [moxifloxacin]    Cannot recall   Benicar [olmesartan]    Cannot recall   Ceftin [cefuroxime]    Cannot recall   Codeine    Hallucinations   Imuran [azathioprine]    Instigates cough   Indocin [indomethacin]    Cannot recall reaction   Methotrexate Derivatives    Instigates cough      Medication List        Accurate as of 01/09/18  1:03 PM. Always use your most recent med list.          albuterol 108 (90 Base) MCG/ACT inhaler Commonly known as:  PROVENTIL HFA;VENTOLIN HFA Inhale 2 puffs into the lungs every 6 (six) hours as needed for wheezing or shortness of breath.   ANTI-DIARRHEAL 2 MG tablet Generic drug:  loperamide Take 2 mg as  needed by mouth for diarrhea or loose stools.   aspirin EC 81 MG tablet Take 81 mg by mouth daily.   buPROPion 300 MG 24 hr tablet Commonly known as:  WELLBUTRIN XL TAKE 1 TABLET (300 MG TOTAL) DAILY BY MOUTH.   CALCIUM 500/D PO Take 500 mg by mouth daily.   cholecalciferol 1000 units tablet Commonly known as:  VITAMIN D Take 4,000 Units by mouth daily.   Cranberry 125 MG Tabs Take 1 tablet by mouth daily.   fluticasone 110 MCG/ACT inhaler Commonly known as:  FLOVENT HFA Inhale 2 puffs into the lungs 2 (two) times daily.   gabapentin 100 MG capsule Commonly known as:  NEURONTIN TAKE 1 CAPSULE BY MOUTH THREE TIMES A DAY   Mesalamine 800 MG Tbec TAKE 3 TABLETS (2,400 MG TOTAL) BY MOUTH 2 (TWO) TIMES DAILY.   metoprolol succinate 100 MG 24 hr tablet Commonly known as:  TOPROL-XL TAKE 1 TABLET (100 MG TOTAL) BY MOUTH DAILY. TAKE WITH OR IMMEDIATELY FOLLOWING A MEAL.   montelukast 10 MG tablet Commonly known as:  SINGULAIR Take 1 tablet (10 mg total) by mouth at bedtime.   multivitamin tablet Take 1 tablet daily by mouth.   pantoprazole 40 MG tablet Commonly known as:  PROTONIX Take 1 tablet (40 mg total) by mouth 2 (two) times daily.   ranitidine 150 MG tablet Commonly known as:  ZANTAC TAKE 2 TABLETS (300 MG TOTAL) AT BEDTIME BY MOUTH.   VICTOZA 18 MG/3ML Sopn Generic drug:  liraglutide Inject 0.2 mLs (1.2 mg total) into the skin daily. Inject once daily at the same time       Allergies:  Allergies  Allergen Reactions  . Avelox [Moxifloxacin]     Cannot recall  . Benicar [Olmesartan]     Cannot recall  . Ceftin [Cefuroxime]     Cannot recall  . Codeine     Hallucinations  . Imuran [Azathioprine]     Instigates cough  . Indocin [Indomethacin]     Cannot recall reaction  . Methotrexate Derivatives     Instigates cough    Past Medical History:  Diagnosis Date  . Acid reflux   . Asthma   . Cough due to ACE inhibitor   . Depression, recurrent  ([place]) 09/16/2016  . Diabetes mellitus without complication ([place])   . DVT (deep venous thrombosis) ([place])   . Hypertension   . Morbid obesity ([place]) 09/16/2016  .  Seasonal allergic rhinitis due to pollen 09/16/2016  . Ulcerative colitis [facility])     Past Surgical History:  Procedure Laterality Date  . ABDOMINAL HYSTERECTOMY    . BREAST BIOPSY Left    benign  . CATARACT EXTRACTION    . CESAREAN SECTION    . COLONOSCOPY  2012,2015,2016   Dr.Andreson and in [place]   . KNEE SURGERY Left   . SHOULDER SURGERY Right     Family History  Problem Relation Age of Onset  . Diabetes Mother   . Dementia Mother   . Stroke Mother   . Coronary artery disease Mother   . Cancer Brother   . Diverticulitis Brother   . Colon cancer Brother 57  . COPD Father   . Breast cancer Cousin   . Rectal cancer Neg Hx   . Stomach cancer Neg Hx     Social History:  reports that she has never smoked. She has never used smokeless tobacco. She reports that she does not drink alcohol or use drugs.   Review of Systems   Lipid history: Not on statins, LDL below 100    Lab Results  Component Value Date   CHOL 150 09/04/2017   HDL 45.30 09/04/2017   LDLCALC 81 09/04/2017   TRIG 119.0 09/04/2017   CHOLHDL 3 09/04/2017           Hypertension: Treated with metoprolol 100 mg from previous physician  Most recent eye exam was 2017 but has finally made follow-up appointment  Most recent foot JZPH:1/50  Complications of diabetes: Peripheral neuropathy with sensory loss, Charcot foot.  No history of retinopathy or nephropathy    Physical Examination:  BP 110/60   Pulse 81   Ht 4' 11"  (1.499 m)   Wt 178 lb 3.2 oz (80.8 kg)   SpO2 94%   BMI 35.99 kg/m      ASSESSMENT:  Diabetes type 2, with obesity  See history of present illness for  discussion of current diabetes management, blood sugar patterns and problems identified   A1c is consistent at 6.4%  This is with low dose Victoza,  currently using 0.9 mg Her weight has gone up slightly recently because of going on medication and poor diet Also she has not exercise although she does have limitations  RENAL dysfunction: She has slight increase in her creatinine which is not new, etiology and unclear as she does not have any microalbuminuria  PLAN:    She will try to check her sugars more after meals  She can start increasing her activity only if she does some walking or other activity for 5 minutes 2 or 3 times a day  No change in Victoza  To call if she has increase in blood sugars  Follow-up in 4 months   There are no Patient Instructions on file for this visit.   Elayne Snare 01/09/2018, 1:03 PM   Note: This office note was prepared with Dragon voice recognition system technology. Any transcriptional errors that result from this process are unintentional.

## 2018-01-09 NOTE — Addendum Note (Signed)
Addended by: Drucilla Schmidt on: 01/09/2018 04:25 PM   Modules accepted: Orders

## 2018-01-09 NOTE — Telephone Encounter (Signed)
Pt needs BD sterile needle 0.25 mm x 5 mm 31 g x 3/16 in called into [facility] on [address] [phone]

## 2018-01-09 NOTE — Telephone Encounter (Signed)
Sent pen needles

## 2018-01-17 ENCOUNTER — Other Ambulatory Visit: Payer: Self-pay | Admitting: Family Medicine

## 2018-01-17 DIAGNOSIS — R05 Cough: Secondary | ICD-10-CM

## 2018-01-17 DIAGNOSIS — R059 Cough, unspecified: Secondary | ICD-10-CM

## 2018-01-21 ENCOUNTER — Encounter: Payer: Self-pay | Admitting: Gastroenterology

## 2018-01-21 ENCOUNTER — Other Ambulatory Visit: Payer: BLUE CROSS/BLUE SHIELD

## 2018-01-21 ENCOUNTER — Ambulatory Visit (INDEPENDENT_AMBULATORY_CARE_PROVIDER_SITE_OTHER): Payer: BLUE CROSS/BLUE SHIELD | Admitting: Gastroenterology

## 2018-01-21 VITALS — BP 110/70 | HR 78 | Ht 59.0 in | Wt 178.1 lb

## 2018-01-21 DIAGNOSIS — R195 Other fecal abnormalities: Secondary | ICD-10-CM | POA: Diagnosis not present

## 2018-01-21 MED ORDER — PANTOPRAZOLE SODIUM 40 MG PO TBEC: 40.0000 mg | DELAYED_RELEASE_TABLET | Freq: Two times a day (BID) | ORAL | 3 refills | Status: DC

## 2018-01-21 NOTE — Patient Instructions (Addendum)
You will have labs checked today in the basement lab.  Please head down after you check out with the front desk  (gi pathogen panel). Call if the loose stools restart. Protonix refills 64m pills, one pill twice daily, disp one month with 11 refills.  Normal BMI (Body Mass Index- based on height and weight) is between 19 and 25. Your BMI today is Body mass index is 35.98 kg/m. .Marland KitchenPlease consider follow up  regarding your BMI with your Primary Care Provider.

## 2018-01-21 NOTE — Progress Notes (Signed)
Review of pertinent gastrointestinal problems: 1. Long-standing ulcerative colitis. She thinks it's mostly been left sided disease.Diagnosed when she was in her early 35s. Cared for in [place] for almost 40 years. On prednisone for at least 38 of those years. Had side effects from azathioprine from what she recalls. Established care Dr. Ardis Hughs May 2018 while on Asacol  2.4grams twice daily, clinically doing well.   Colonoscopy 12/2013 Dr. Jeneen Rinks [place]:Terminal ileum was not entered.  "There was evidence of chronic inflammation rectosigmoid with a granular appearance" no other inflammation was noted.  A 9 mm polyp was removed from the cecum.  A 7 mm polyp was removed from the hepatic flexure.  Random biopsies were also taken.   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  The biopsies from the right colon and left colon were completely norma.  lColonoscopy Dr. Ardis Hughs 10/2017: Normal terminal ileum.  Colonic mucosal was slightly granular throughout.  Biopsies taken from the left and right colon.  Pathology showed no inflammation.   HPI: This is a very pleasant 62 year old woman whom I last saw the time of colonoscopy about 3 months ago.  See that results summarized above.  4 weeks ago she was on anbitioics for URI; doxycyline 10 days.  Towards the end her bowels softened and they stayed soft for at least 3 weeks.  Not watery.  Semi formed.  No real pains in her abd.  No fevers or chills. A bit more frequent than usual. Never bled.  Continued on her mesalamine orally twice daily.  Normally she takes imodium 2-3 times per week for loose stools, this has been her pattern for at least 2 or 3 years.  During her issues above, she needed 4 imodium twice per day.   Chief complaint is acute diarrhea in the setting of long-standing ulcerative colitis  ROS: complete GI ROS as described in HPI, all other review negative.  Constitutional:  No unintentional weight loss   Past  Medical History:  Diagnosis Date  . Acid reflux   . Asthma   . Cough due to ACE inhibitor   . Depression, recurrent ([place]) 09/16/2016  . Diabetes mellitus without complication ([place])   . DVT (deep venous thrombosis) ([place])   . Hypertension   . Morbid obesity ([place]) 09/16/2016  . Seasonal allergic rhinitis due to pollen 09/16/2016  . Ulcerative colitis [facility])     Past Surgical History:  Procedure Laterality Date  . ABDOMINAL HYSTERECTOMY    . BREAST BIOPSY Left    benign  . CATARACT EXTRACTION    . CESAREAN SECTION    . COLONOSCOPY  2012,2015,2016   Dr.Andreson and in [place]   . KNEE SURGERY Left   . SHOULDER SURGERY Right     Current Outpatient Medications  Medication Sig Dispense Refill  . albuterol (PROVENTIL HFA;VENTOLIN HFA) 108 (90 Base) MCG/ACT inhaler TAKE 2 PUFFS BY MOUTH EVERY 6 HOURS AS NEEDED FOR WHEEZE OR SHORTNESS OF BREATH 8.5 Inhaler 2  . aspirin EC 81 MG tablet Take 81 mg by mouth daily.    Marland Kitchen buPROPion (WELLBUTRIN XL) 300 MG 24 hr tablet TAKE 1 TABLET (300 MG TOTAL) DAILY BY MOUTH. 90 tablet 1  . Calcium Carbonate-Vitamin D (CALCIUM 500/D PO) Take 500 mg by mouth daily.    . cholecalciferol (VITAMIN D) 1000 units tablet Take 4,000 Units by mouth daily.    . Cranberry 125 MG TABS Take 1 tablet by mouth daily.    . fluticasone (FLOVENT HFA) 110  MCG/ACT inhaler Inhale 2 puffs into the lungs 2 (two) times daily. 1 Inhaler 12  . gabapentin (NEURONTIN) 100 MG capsule TAKE 1 CAPSULE BY MOUTH THREE TIMES A DAY 270 capsule 0  . Insulin Pen Needle (BD PEN NEEDLE NANO U/F) 32G X 4 MM MISC Use with victoza pen 100 each 0  . loperamide (ANTI-DIARRHEAL) 2 MG tablet Take 2 mg as needed by mouth for diarrhea or loose stools.    . metoprolol succinate (TOPROL-XL) 100 MG 24 hr tablet TAKE 1 TABLET (100 MG TOTAL) BY MOUTH DAILY. TAKE WITH OR IMMEDIATELY FOLLOWING A MEAL. 90 tablet 1  . montelukast (SINGULAIR) 10 MG tablet Take 1 tablet (10 mg total) by mouth at bedtime. 90 tablet  1  . Multiple Vitamin (MULTIVITAMIN) tablet Take 1 tablet daily by mouth.    Marland Kitchen VICTOZA 18 MG/3ML SOPN Inject 0.2 mLs (1.2 mg total) into the skin daily. Inject once daily at the same time (Patient taking differently: Inject 1.2 mg into the skin daily. Inject once daily at the same time) 9 pen 1  . Mesalamine 800 MG TBEC TAKE 3 TABLETS (2,400 MG TOTAL) BY MOUTH 2 (TWO) TIMES DAILY. 540 tablet 3  . pantoprazole (PROTONIX) 40 MG tablet Take 1 tablet (40 mg total) by mouth 2 (two) times daily. 180 tablet 3  . ranitidine (ZANTAC) 150 MG tablet TAKE 2 TABLETS (300 MG TOTAL) AT BEDTIME BY MOUTH. 60 tablet 3   No current facility-administered medications for this visit.     Allergies as of 01/21/2018 - Review Complete 01/21/2018  Allergen Reaction Noted  . Avelox [moxifloxacin]  09/11/2016  . Benicar [olmesartan]  09/11/2016  . Ceftin [cefuroxime]  09/11/2016  . Codeine  09/11/2016  . Imuran [azathioprine]  09/11/2016  . Indocin [indomethacin]  09/11/2016  . Methotrexate derivatives  09/11/2016    Family History  Problem Relation Age of Onset  . Diabetes Mother   . Dementia Mother   . Stroke Mother   . Coronary artery disease Mother   . Cancer Brother   . Diverticulitis Brother   . Colon cancer Brother 91  . COPD Father   . Breast cancer Cousin   . Rectal cancer Neg Hx   . Stomach cancer Neg Hx     Social History   Socioeconomic History  . Marital status: Married    Spouse name: Not on file  . Number of children: Not on file  . Years of education: Not on file  . Highest education level: Not on file  Occupational History  . Not on file  Social Needs  . Financial resource strain: Not on file  . Food insecurity:    Worry: Not on file    Inability: Not on file  . Transportation needs:    Medical: Not on file    Non-medical: Not on file  Tobacco Use  . Smoking status: Never Smoker  . Smokeless tobacco: Never Used  Substance and Sexual Activity  . Alcohol use: No  . Drug  use: No  . Sexual activity: Not on file  Lifestyle  . Physical activity:    Days per week: Not on file    Minutes per session: Not on file  . Stress: Not on file  Relationships  . Social connections:    Talks on phone: Not on file    Gets together: Not on file    Attends religious service: Not on file    Active member of club or organization: Not on file  Attends meetings of clubs or organizations: Not on file    Relationship status: Not on file  . Intimate partner violence:    Fear of current or ex partner: Not on file    Emotionally abused: Not on file    Physically abused: Not on file    Forced sexual activity: Not on file  Other Topics Concern  . Not on file  Social History Narrative  . Not on file     Physical Exam: BP 110/70   Pulse 78   Ht 4' 11"  (1.499 m)   Wt 178 lb 2 oz (80.8 kg)   BMI 35.98 kg/m  Constitutional: generally well-appearing Psychiatric: alert and oriented x3 Abdomen: soft, nontender, nondistended, no obvious ascites, no peritoneal signs, normal bowel sounds No peripheral edema noted in lower extremities  Assessment and plan: 62 y.o. female with acute diarrhea in the setting of long-standing ulcerative colitis  I do not think that she has been having a flare of his her ulcerative colitis.  I think much more likely is change in her bowel habits related to recent antibiotic use.  Her symptoms seem to be improving a bit but this could just perhaps be a temporary "low in the storm" and so I am going to get stool check with GI pathogen panel.  She knows to call if she has a return of significant loose stools.  She will continue on her mesalamine  twice daily.  She need a refill on her tongue pump inhibitor I am happy to do that for her today as well.  Please see the "Patient Instructions" section for addition details about the plan.  Owens Loffler, MD [facility] 01/21/2018, 3:01 PM

## 2018-01-22 ENCOUNTER — Other Ambulatory Visit: Payer: BLUE CROSS/BLUE SHIELD

## 2018-01-22 DIAGNOSIS — R195 Other fecal abnormalities: Secondary | ICD-10-CM | POA: Diagnosis not present

## 2018-01-23 LAB — GASTROINTESTINAL PATHOGEN PANEL PCR
C. difficile Tox A/B, PCR: NOT DETECTED
CAMPYLOBACTER, PCR: NOT DETECTED
Cryptosporidium, PCR: NOT DETECTED
E COLI (ETEC) LT/ST, PCR: NOT DETECTED
E coli (STEC) stx1/stx2, PCR: NOT DETECTED
E coli 0157, PCR: NOT DETECTED
Giardia lamblia, PCR: NOT DETECTED
NOROVIRUS, PCR: NOT DETECTED
Rotavirus A, PCR: NOT DETECTED
SHIGELLA, PCR: NOT DETECTED
Salmonella, PCR: NOT DETECTED

## 2018-01-28 ENCOUNTER — Other Ambulatory Visit: Payer: Self-pay | Admitting: Family Medicine

## 2018-01-28 DIAGNOSIS — E1142 Type 2 diabetes mellitus with diabetic polyneuropathy: Secondary | ICD-10-CM

## 2018-01-28 NOTE — Telephone Encounter (Signed)
Please advise. Patient has been taking more and wanting more daily prescribed.

## 2018-01-29 ENCOUNTER — Other Ambulatory Visit: Payer: Self-pay | Admitting: Endocrinology

## 2018-01-30 DIAGNOSIS — Z961 Presence of intraocular lens: Secondary | ICD-10-CM | POA: Diagnosis not present

## 2018-01-30 DIAGNOSIS — H5211 Myopia, right eye: Secondary | ICD-10-CM | POA: Diagnosis not present

## 2018-01-30 DIAGNOSIS — E119 Type 2 diabetes mellitus without complications: Secondary | ICD-10-CM | POA: Diagnosis not present

## 2018-01-30 LAB — HM DIABETES EYE EXAM

## 2018-01-30 MED ORDER — GABAPENTIN 300 MG PO CAPS: 300.0000 mg | ORAL_CAPSULE | Freq: Three times a day (TID) | ORAL | 3 refills | Status: DC

## 2018-01-30 NOTE — Telephone Encounter (Signed)
Changing to 300 mg capsule.

## 2018-01-30 NOTE — Telephone Encounter (Signed)
Pt following up on refill request gabapentin (NEURONTIN) 100 MG capsule  Pt states she takes as much as 5 a day and wants to know if Rx can be prescribed differently.  [phone](Phone) [phone](Fax)

## 2018-02-04 ENCOUNTER — Telehealth: Payer: Self-pay | Admitting: Family Medicine

## 2018-02-04 MED ORDER — GABAPENTIN 100 MG PO CAPS: 100.0000 mg | ORAL_CAPSULE | Freq: Every morning | ORAL | 0 refills | Status: DC

## 2018-02-04 NOTE — Telephone Encounter (Signed)
Okay 

## 2018-02-04 NOTE — Telephone Encounter (Signed)
Prescription sent to the pharmacy. Notified patient.

## 2018-02-04 NOTE — Telephone Encounter (Signed)
Ok to change

## 2018-02-04 NOTE — Telephone Encounter (Signed)
Copied from [place] [phone]. Topic: Quick Communication - See Telephone Encounter >> Feb 04, 2018  9:30 AM Tara Garner wrote: Pt and doctor Juleen China discussed pt taking less dosage amount of the gabapentin in the mornings.  Pt states the  300 MG capsule is too much to take in the morning.  This makes her sleepy or makes her sleep during the day time. Pt asking if Juleen China can call in like the 100 mg they discussed for her to take in the morning times.

## 2018-02-04 NOTE — Addendum Note (Signed)
Addended by: Durwin Glaze on: 02/04/2018 03:11 PM   Modules accepted: Orders

## 2018-02-14 ENCOUNTER — Telehealth: Payer: Self-pay | Admitting: Gastroenterology

## 2018-02-14 NOTE — Telephone Encounter (Signed)
The pt is calling to update on her condition, she states her stools are still loose and often contain some mucous.  She states she is taking imodium 2 BID without much relief.  Forwarded to Dr Ardis Hughs for review and further req's.

## 2018-02-18 MED ORDER — METRONIDAZOLE 250 MG PO TABS: 250.0000 mg | ORAL_TABLET | Freq: Three times a day (TID) | ORAL | 0 refills | Status: AC

## 2018-02-18 NOTE — Telephone Encounter (Signed)
Colonoscopy 3 months ago essentially normal, biopsies normal.  GI pathogen panel negative.  Her bowel changes clearly started after abx.  Lets try her on flagyl 238m po tid for 10 days, no refills, she needs to call in 2 weeks or sooner if getting worse.

## 2018-02-18 NOTE — Telephone Encounter (Signed)
The pt has been advised and prescription sent  She will call back in 2 weeks to update on her condition

## 2018-02-18 NOTE — Telephone Encounter (Signed)
Left message on machine to call back  

## 2018-02-21 ENCOUNTER — Other Ambulatory Visit: Payer: Self-pay | Admitting: Family Medicine

## 2018-02-24 ENCOUNTER — Other Ambulatory Visit: Payer: Self-pay | Admitting: Gastroenterology

## 2018-02-25 ENCOUNTER — Other Ambulatory Visit: Payer: Self-pay | Admitting: Family Medicine

## 2018-02-25 NOTE — Telephone Encounter (Signed)
Okay Rx as she is taking.

## 2018-02-25 NOTE — Telephone Encounter (Signed)
Ok to send new script?

## 2018-02-25 NOTE — Telephone Encounter (Signed)
Copied from [place] [phone]. Topic: Quick Communication - See Telephone Encounter >> Feb 25, 2018 10:54 AM Conception Chancy, NT wrote: CRM for notification. See Telephone encounter for: 02/25/18.  Patient is calling and requesting a refill on gabapentin (NEURONTIN) 100 MG capsule. She states she takes this 2x a day some days. Depending on her pain, therefore she is out.  [id number]: [phone]: [phone]

## 2018-02-25 NOTE — Telephone Encounter (Signed)
Called pt and she stated she was advised by her PCP that she could take 100 mg of Gabapentin in the morning, 100 mg gabapentin in the afternoon or evening. Pt stated she takes the 300 mg dose at bedtime. Pt will need a new prescription for 30 days to reflect how often she is taking the 138m.

## 2018-02-26 MED ORDER — GABAPENTIN 100 MG PO CAPS: 100.0000 mg | ORAL_CAPSULE | Freq: Two times a day (BID) | ORAL | 3 refills | Status: DC

## 2018-03-13 DIAGNOSIS — Z23 Encounter for immunization: Secondary | ICD-10-CM | POA: Diagnosis not present

## 2018-03-20 ENCOUNTER — Other Ambulatory Visit: Payer: Self-pay | Admitting: Family Medicine

## 2018-03-29 ENCOUNTER — Other Ambulatory Visit: Payer: Self-pay | Admitting: Family Medicine

## 2018-03-31 ENCOUNTER — Telehealth: Payer: Self-pay | Admitting: Family Medicine

## 2018-03-31 ENCOUNTER — Other Ambulatory Visit (HOSPITAL_COMMUNITY)
Admission: RE | Admit: 2018-03-31 | Discharge: 2018-03-31 | Disposition: A | Discharge status: Home / Self Care | Payer: BLUE CROSS/BLUE SHIELD | Source: Ambulatory Visit | Attending: Physician Assistant | Admitting: Physician Assistant

## 2018-03-31 ENCOUNTER — Ambulatory Visit (INDEPENDENT_AMBULATORY_CARE_PROVIDER_SITE_OTHER): Payer: BLUE CROSS/BLUE SHIELD | Admitting: Physician Assistant

## 2018-03-31 ENCOUNTER — Encounter: Payer: Self-pay | Admitting: Physician Assistant

## 2018-03-31 VITALS — BP 118/70 | HR 70 | Temp 97.5°F | Ht 59.0 in | Wt 178.0 lb

## 2018-03-31 DIAGNOSIS — R350 Frequency of micturition: Secondary | ICD-10-CM

## 2018-03-31 DIAGNOSIS — N898 Other specified noninflammatory disorders of vagina: Secondary | ICD-10-CM | POA: Diagnosis not present

## 2018-03-31 DIAGNOSIS — R300 Dysuria: Secondary | ICD-10-CM | POA: Diagnosis not present

## 2018-03-31 LAB — POCT URINALYSIS DIPSTICK
BILIRUBIN UA: NEGATIVE
Blood, UA: NEGATIVE
Glucose, UA: NEGATIVE
Ketones, UA: NEGATIVE
Nitrite, UA: NEGATIVE
Protein, UA: NEGATIVE
Spec Grav, UA: 1.025 (ref 1.010–1.025)
UROBILINOGEN UA: 0.2 U/dL
pH, UA: 6 (ref 5.0–8.0)

## 2018-03-31 MED ORDER — FLUCONAZOLE 150 MG PO TABS: 150.0000 mg | ORAL_TABLET | Freq: Once | ORAL | 0 refills | Status: AC

## 2018-03-31 MED ORDER — NYSTATIN 100000 UNIT/GM EX OINT: TOPICAL_OINTMENT | CUTANEOUS | 0 refills | Status: DC

## 2018-03-31 MED ORDER — CIPROFLOXACIN HCL 250 MG PO TABS: 250.0000 mg | ORAL_TABLET | Freq: Two times a day (BID) | ORAL | 0 refills | Status: AC

## 2018-03-31 NOTE — Patient Instructions (Signed)
It was great to see you!  I have sent in the oral diflucan tablet x 1 dose. This will hopefully help your itching.  I recommend going ahead and starting the oral antibiotic ciprofloxacin for your symptoms. I will give you a call regarding your urine culture.  Take care,  Inda Coke PA-C   Contact a doctor if:  You have back pain.  You have a fever.  You feel sick to your stomach (nauseous).  You throw up (vomit).  Your symptoms do not get better after 3 days.  Your symptoms go away and then come back. Get help right away if:  You have very bad back pain.  You have very bad lower belly (abdominal) pain.  You are throwing up and cannot keep down any medicines or water. This information is not intended to replace advice given to you by your health care provider. Make sure you discuss any questions you have with your health care provider.

## 2018-03-31 NOTE — Telephone Encounter (Signed)
Copied from [place] [id number]. Topic: Quick Communication - Rx Refill/Question >> Mar 31, 2018 12:46 PM Reyne Dumas L wrote: Medication:  Nystatin  Pt states she thought she should be getting a script for Nystatin after OV today, but it is not at the pharmacy  Has the patient contacted their pharmacy? No - new medication (Agent: If no, request that the patient contact the pharmacy for the refill.) (Agent: If yes, when and what did the pharmacy advise?)  Preferred Pharmacy (with phone number or street name):   [id number] [place] [phone](Phone) [phone](Fax)  Agent: Please be advised that RX refills may take up to 3 business days. We ask that you follow-up with your pharmacy.

## 2018-03-31 NOTE — Progress Notes (Signed)
Tara Garner is a 62 y.o. female here for a new problem.  I acted as a Education administrator for [employer], PA-C Anselmo Pickler, LPN  History of Present Illness:   Chief Complaint  Patient presents with  . Urinary Tract Infection    Urinary Tract Infection   This is a new problem. Episode onset: Started Thursday. The problem occurs every urination. The problem has been gradually worsening. The quality of the pain is described as burning and aching. The pain is at a severity of 9/10. The pain is severe. There has been no fever. She is not sexually active. There is no history of pyelonephritis. Associated symptoms include chills, a discharge (white vaginal discharge with itching), flank pain, frequency and urgency. Pertinent negatives include no hematuria or nausea. Associated symptoms comments: Hemorrhoids. Treatments tried: Vagisil, Preperation H, vasoline. The treatment provided mild relief. Her past medical history is significant for recurrent UTIs.   She has tried vagisil without relief. She is leaving for a trip soon and wants to make sure she feels better prior to her trip.   Past Medical History:  Diagnosis Date  . Acid reflux   . Asthma   . Cough due to ACE inhibitor   . Depression, recurrent ([place]) 09/16/2016  . Diabetes mellitus without complication ([place])   . DVT (deep venous thrombosis) ([place])   . Hypertension   . Morbid obesity ([place]) 09/16/2016  . Seasonal allergic rhinitis due to pollen 09/16/2016  . Ulcerative colitis ([place])      Social History   Socioeconomic History  . Marital status: Married    Spouse name: Not on file  . Number of children: Not on file  . Years of education: Not on file  . Highest education level: Not on file  Occupational History  . Not on file  Social Needs  . Financial resource strain: Not on file  . Food insecurity:    Worry: Not on file    Inability: Not on file  . Transportation needs:    Medical: Not on file    Non-medical: Not on file   Tobacco Use  . Smoking status: Never Smoker  . Smokeless tobacco: Never Used  Substance and Sexual Activity  . Alcohol use: No  . Drug use: No  . Sexual activity: Not on file  Lifestyle  . Physical activity:    Days per week: Not on file    Minutes per session: Not on file  . Stress: Not on file  Relationships  . Social connections:    Talks on phone: Not on file    Gets together: Not on file    Attends religious service: Not on file    Active member of club or organization: Not on file    Attends meetings of clubs or organizations: Not on file    Relationship status: Not on file  . Intimate partner violence:    Fear of current or ex partner: Not on file    Emotionally abused: Not on file    Physically abused: Not on file    Forced sexual activity: Not on file  Other Topics Concern  . Not on file  Social History Narrative  . Not on file    Past Surgical History:  Procedure Laterality Date  . ABDOMINAL HYSTERECTOMY    . BREAST BIOPSY Left    benign  . CATARACT EXTRACTION    . CESAREAN SECTION    . COLONOSCOPY  2012,2015,2016   Dr.Andreson and in [place]   .  KNEE SURGERY Left   . SHOULDER SURGERY Right     Family History  Problem Relation Age of Onset  . Diabetes Mother   . Dementia Mother   . Stroke Mother   . Coronary artery disease Mother   . Cancer Brother   . Diverticulitis Brother   . Colon cancer Brother 64  . COPD Father   . Breast cancer Cousin   . Rectal cancer Neg Hx   . Stomach cancer Neg Hx     Allergies  Allergen Reactions  . Avelox [Moxifloxacin]     Cannot recall  . Benicar [Olmesartan]     Cannot recall  . Ceftin [Cefuroxime]     Cannot recall  . Codeine     Hallucinations  . Imuran [Azathioprine]     Instigates cough  . Indocin [Indomethacin]     Cannot recall reaction  . Methotrexate Derivatives     Instigates cough    Current Medications:   Current Outpatient Medications:  .  albuterol (PROVENTIL HFA;VENTOLIN  HFA) 108 (90 Base) MCG/ACT inhaler, TAKE 2 PUFFS BY MOUTH EVERY 6 HOURS AS NEEDED FOR WHEEZE OR SHORTNESS OF BREATH, Disp: 8.5 Inhaler, Rfl: 2 .  aspirin EC 81 MG tablet, Take 81 mg by mouth daily., Disp: , Rfl:  .  buPROPion (WELLBUTRIN XL) 300 MG 24 hr tablet, TAKE 1 TABLET (300 MG TOTAL) DAILY BY MOUTH., Disp: 90 tablet, Rfl: 1 .  Calcium Carbonate-Vitamin D (CALCIUM 500/D PO), Take 500 mg by mouth daily., Disp: , Rfl:  .  cholecalciferol (VITAMIN D) 1000 units tablet, Take 4,000 Units by mouth daily., Disp: , Rfl:  .  Cranberry 125 MG TABS, Take 1 tablet by mouth daily., Disp: , Rfl:  .  fluticasone (FLOVENT HFA) 110 MCG/ACT inhaler, Inhale 2 puffs into the lungs 2 (two) times daily., Disp: 1 Inhaler, Rfl: 12 .  gabapentin (NEURONTIN) 100 MG capsule, TAKE 1 CAPSULE BY MOUTH TWICE A DAY, Disp: 180 capsule, Rfl: 2 .  gabapentin (NEURONTIN) 300 MG capsule, TAKE 1 CAPSULE BY MOUTH THREE TIMES A DAY, Disp: 360 capsule, Rfl: 1 .  Insulin Pen Needle (BD PEN NEEDLE NANO U/F) 32G X 4 MM MISC, Use with victoza pen, Disp: 100 each, Rfl: 0 .  loperamide (ANTI-DIARRHEAL) 2 MG tablet, Take 2 mg as needed by mouth for diarrhea or loose stools., Disp: , Rfl:  .  metoprolol succinate (TOPROL-XL) 100 MG 24 hr tablet, TAKE 1 TABLET (100 MG TOTAL) BY MOUTH DAILY. TAKE WITH OR IMMEDIATELY FOLLOWING A MEAL., Disp: 90 tablet, Rfl: 1 .  montelukast (SINGULAIR) 10 MG tablet, TAKE 1 TABLET BY MOUTH EVERYDAY AT BEDTIME, Disp: 90 tablet, Rfl: 1 .  Multiple Vitamin (MULTIVITAMIN) tablet, Take 1 tablet daily by mouth., Disp: , Rfl:  .  pantoprazole (PROTONIX) 40 MG tablet, Take 1 tablet (40 mg total) by mouth 2 (two) times daily., Disp: 180 tablet, Rfl: 3 .  ranitidine (ZANTAC) 150 MG tablet, TAKE 2 TABLETS (300 MG TOTAL) BY MOUTH AT BEDTIME, Disp: 180 tablet, Rfl: 3 .  VICTOZA 18 MG/3ML SOPN, INJECT 0.2 MLS (1.2 MG TOTAL) INTO THE SKIN DAILY. INJECT ONCE DAILY AT THE SAME TIME, Disp: 6 pen, Rfl: 1 .  ciprofloxacin (CIPRO)  250 MG tablet, Take 1 tablet (250 mg total) by mouth 2 (two) times daily for 7 days., Disp: 14 tablet, Rfl: 0 .  fluconazole (DIFLUCAN) 150 MG tablet, Take 1 tablet (150 mg total) by mouth once for 1 dose., Disp: 1 tablet, Rfl:  0 .  Mesalamine 800 MG TBEC, TAKE 3 TABLETS (2,400 MG TOTAL) BY MOUTH 2 (TWO) TIMES DAILY., Disp: 540 tablet, Rfl: 3   Review of Systems:   Review of Systems  Constitutional: Positive for chills.  Gastrointestinal: Negative for nausea.  Genitourinary: Positive for flank pain, frequency and urgency. Negative for hematuria.    Vitals:   Vitals:   03/31/18 1028  BP: 118/70  Pulse: 70  Temp: (!) 97.5 F (36.4 C)  TempSrc: Oral  SpO2: 97%  Weight: 178 lb (80.7 kg)  Height: 4' 11"  (1.499 m)     Body mass index is 35.95 kg/m.  Physical Exam:   Physical Exam  Constitutional: She appears well-developed. She is cooperative.  Non-toxic appearance. She does not have a sickly appearance. She does not appear ill. No distress.  Cardiovascular: Normal rate, regular rhythm, S1 normal, S2 normal, normal heart sounds and normal pulses.  Pulmonary/Chest: Effort normal and breath sounds normal.  Abdominal: Normal appearance and bowel sounds are normal. There is tenderness in the suprapubic area. There is no CVA tenderness.  Genitourinary: Vagina normal. Rectal exam shows external hemorrhoid. Pelvic exam was performed with patient prone. There is no rash, tenderness or lesion on the right labia. There is no rash, tenderness or lesion on the left labia. No erythema in the vagina. No vaginal discharge found.  Neurological: She is alert. GCS eye subscore is 4. GCS verbal subscore is 5. GCS motor subscore is 6.  Skin: Skin is warm, dry and intact.  Psychiatric: She has a normal mood and affect. Her speech is normal and behavior is normal.  Nursing note and vitals reviewed.   Results for orders placed or performed in visit on 03/31/18  POCT urinalysis dipstick  Result Value  Ref Range   Color, UA Yellow    Clarity, UA Slightly Cloudy    Glucose, UA Negative Negative   Bilirubin, UA Negative    Ketones, UA Negative    Spec Grav, UA 1.025 1.010 - 1.025   Blood, UA Negative    pH, UA 6.0 5.0 - 8.0   Protein, UA Negative Negative   Urobilinogen, UA 0.2 0.2 or 1.0 E.U./dL   Nitrite, UA Negative    Leukocytes, UA Trace (A) Negative   Appearance     Odor      Assessment and Plan:    Tara Garner was seen today for urinary tract infection.  Diagnoses and all orders for this visit:  Dysuria; Frequent urination, concern for UTI UA without blood but does have trace bacteria. Given DM status, will treat until culture returns. She has had ciprofloxacin in the past and tolerated well. Follow-up if symptoms worsen or persist despite treatment. -     POCT urinalysis dipstick -     Urine Culture  Vaginal itching Suspect possible yeast infection, however we did obtain swab to check for this as well as BV. Will trial one dose treatment of diflucan. -     Cervicovaginal ancillary only  Other orders -     ciprofloxacin (CIPRO) 250 MG tablet; Take 1 tablet (250 mg total) by mouth 2 (two) times daily for 7 days. -     fluconazole (DIFLUCAN) 150 MG tablet; Take 1 tablet (150 mg total) by mouth once for 1 dose.  . Reviewed expectations re: course of current medical issues. . Discussed self-management of symptoms. . Outlined signs and symptoms indicating need for more acute intervention. . Patient verbalized understanding and all questions were answered. Marland Kitchen See  orders for this visit as documented in the electronic medical record. . Patient received an After-Visit Summary.  CMA or LPN served as scribe during this visit. History, Physical, and Plan performed by medical provider. The above documentation has been reviewed and is accurate and complete.  Inda Coke, PA-C

## 2018-03-31 NOTE — Telephone Encounter (Signed)
See note

## 2018-03-31 NOTE — Telephone Encounter (Signed)
I have just sent this in.  Thanks!

## 2018-03-31 NOTE — Telephone Encounter (Signed)
Notified patient that prescription has been sent in.

## 2018-03-31 NOTE — Telephone Encounter (Signed)
Patient seen you today.

## 2018-04-01 LAB — CERVICOVAGINAL ANCILLARY ONLY
BACTERIAL VAGINITIS: NEGATIVE
Candida vaginitis: NEGATIVE

## 2018-04-02 ENCOUNTER — Encounter: Payer: Self-pay | Admitting: Physician Assistant

## 2018-04-02 ENCOUNTER — Ambulatory Visit (INDEPENDENT_AMBULATORY_CARE_PROVIDER_SITE_OTHER): Payer: BLUE CROSS/BLUE SHIELD | Admitting: Physician Assistant

## 2018-04-02 VITALS — BP 130/78 | HR 76 | Temp 97.7°F | Ht 59.0 in | Wt 178.0 lb

## 2018-04-02 DIAGNOSIS — N9089 Other specified noninflammatory disorders of vulva and perineum: Secondary | ICD-10-CM | POA: Diagnosis not present

## 2018-04-02 LAB — URINE CULTURE
MICRO NUMBER:: 91324202
SPECIMEN QUALITY: ADEQUATE

## 2018-04-02 MED ORDER — LIDOCAINE HCL 2 % EX GEL: CUTANEOUS | 0 refills | Status: DC

## 2018-04-02 NOTE — Patient Instructions (Signed)
  Start using a good barrier for your irritated area.  Consider wearing padcicles: apply witch hazel and aloe vera gel to a pad and put in fridge, after it has cooled down may wear to help soothe your irritation.  I have also sent in lidocaine jelly to help numb things when they get really uncomfortable.

## 2018-04-02 NOTE — Progress Notes (Signed)
Tara Garner is a 62 y.o. female here for a follow up of a pre-existing problem.  I acted as a Education administrator for [employer], PA-C Anselmo Pickler, LPN  History of Present Illness:   Chief Complaint  Patient presents with  . Vaginal Itching  . vaginal burning    Female GU Problem  The patient's primary symptoms include genital itching. Primary symptoms comment: vaginal burning, and vulva area red, raw and bleeding.. This is a recurrent problem. The problem has been gradually worsening. The pain is severe. The problem affects both sides. She is not pregnant. Pertinent negatives include no abdominal pain, chills, discolored urine, fever, flank pain, headaches, hematuria, nausea or vomiting. The symptoms are aggravated by urinating. Treatments tried: Vagisil and Vasoline. The treatment provided no relief. She is not sexually active. No, her partner does not have an STD. She is postmenopausal.   She had worsening symptoms with nystatin ointment. Denies any further urinary symptoms whatsoever.   Past Medical History:  Diagnosis Date  . Acid reflux   . Asthma   . Cough due to ACE inhibitor   . Depression, recurrent ([place]) 09/16/2016  . Diabetes mellitus without complication ([place])   . DVT (deep venous thrombosis) ([place])   . Hypertension   . Morbid obesity ([place]) 09/16/2016  . Seasonal allergic rhinitis due to pollen 09/16/2016  . Ulcerative colitis ([place])      Social History   Socioeconomic History  . Marital status: Married    Spouse name: Not on file  . Number of children: Not on file  . Years of education: Not on file  . Highest education level: Not on file  Occupational History  . Not on file  Social Needs  . Financial resource strain: Not on file  . Food insecurity:    Worry: Not on file    Inability: Not on file  . Transportation needs:    Medical: Not on file    Non-medical: Not on file  Tobacco Use  . Smoking status: Never Smoker  . Smokeless tobacco: Never Used   Substance and Sexual Activity  . Alcohol use: No  . Drug use: No  . Sexual activity: Not on file  Lifestyle  . Physical activity:    Days per week: Not on file    Minutes per session: Not on file  . Stress: Not on file  Relationships  . Social connections:    Talks on phone: Not on file    Gets together: Not on file    Attends religious service: Not on file    Active member of club or organization: Not on file    Attends meetings of clubs or organizations: Not on file    Relationship status: Not on file  . Intimate partner violence:    Fear of current or ex partner: Not on file    Emotionally abused: Not on file    Physically abused: Not on file    Forced sexual activity: Not on file  Other Topics Concern  . Not on file  Social History Narrative  . Not on file    Past Surgical History:  Procedure Laterality Date  . ABDOMINAL HYSTERECTOMY    . BREAST BIOPSY Left    benign  . CATARACT EXTRACTION    . CESAREAN SECTION    . COLONOSCOPY  2012,2015,2016   Dr.Andreson and in [place]   . KNEE SURGERY Left   . SHOULDER SURGERY Right     Family History  Problem Relation  Age of Onset  . Diabetes Mother   . Dementia Mother   . Stroke Mother   . Coronary artery disease Mother   . Cancer Brother   . Diverticulitis Brother   . Colon cancer Brother 18  . COPD Father   . Breast cancer Cousin   . Rectal cancer Neg Hx   . Stomach cancer Neg Hx     Allergies  Allergen Reactions  . Avelox [Moxifloxacin]     Cannot recall  . Benicar [Olmesartan]     Cannot recall  . Ceftin [Cefuroxime]     Cannot recall  . Codeine     Hallucinations  . Imuran [Azathioprine]     Instigates cough  . Indocin [Indomethacin]     Cannot recall reaction  . Methotrexate Derivatives     Instigates cough    Current Medications:   Current Outpatient Medications:  .  albuterol (PROVENTIL HFA;VENTOLIN HFA) 108 (90 Base) MCG/ACT inhaler, TAKE 2 PUFFS BY MOUTH EVERY 6 HOURS AS NEEDED  FOR WHEEZE OR SHORTNESS OF BREATH, Disp: 8.5 Inhaler, Rfl: 2 .  aspirin EC 81 MG tablet, Take 81 mg by mouth daily., Disp: , Rfl:  .  buPROPion (WELLBUTRIN XL) 300 MG 24 hr tablet, TAKE 1 TABLET (300 MG TOTAL) DAILY BY MOUTH., Disp: 90 tablet, Rfl: 1 .  Calcium Carbonate-Vitamin D (CALCIUM 500/D PO), Take 500 mg by mouth daily., Disp: , Rfl:  .  cholecalciferol (VITAMIN D) 1000 units tablet, Take 4,000 Units by mouth daily., Disp: , Rfl:  .  ciprofloxacin (CIPRO) 250 MG tablet, Take 1 tablet (250 mg total) by mouth 2 (two) times daily for 7 days., Disp: 14 tablet, Rfl: 0 .  Cranberry 125 MG TABS, Take 1 tablet by mouth daily., Disp: , Rfl:  .  fluticasone (FLOVENT HFA) 110 MCG/ACT inhaler, Inhale 2 puffs into the lungs 2 (two) times daily., Disp: 1 Inhaler, Rfl: 12 .  gabapentin (NEURONTIN) 100 MG capsule, TAKE 1 CAPSULE BY MOUTH TWICE A DAY, Disp: 180 capsule, Rfl: 2 .  gabapentin (NEURONTIN) 300 MG capsule, TAKE 1 CAPSULE BY MOUTH THREE TIMES A DAY, Disp: 360 capsule, Rfl: 1 .  Insulin Pen Needle (BD PEN NEEDLE NANO U/F) 32G X 4 MM MISC, Use with victoza pen, Disp: 100 each, Rfl: 0 .  loperamide (ANTI-DIARRHEAL) 2 MG tablet, Take 2 mg as needed by mouth for diarrhea or loose stools., Disp: , Rfl:  .  metoprolol succinate (TOPROL-XL) 100 MG 24 hr tablet, TAKE 1 TABLET (100 MG TOTAL) BY MOUTH DAILY. TAKE WITH OR IMMEDIATELY FOLLOWING A MEAL., Disp: 90 tablet, Rfl: 1 .  montelukast (SINGULAIR) 10 MG tablet, TAKE 1 TABLET BY MOUTH EVERYDAY AT BEDTIME, Disp: 90 tablet, Rfl: 1 .  Multiple Vitamin (MULTIVITAMIN) tablet, Take 1 tablet daily by mouth., Disp: , Rfl:  .  pantoprazole (PROTONIX) 40 MG tablet, Take 1 tablet (40 mg total) by mouth 2 (two) times daily., Disp: 180 tablet, Rfl: 3 .  ranitidine (ZANTAC) 150 MG tablet, TAKE 2 TABLETS (300 MG TOTAL) BY MOUTH AT BEDTIME, Disp: 180 tablet, Rfl: 3 .  VICTOZA 18 MG/3ML SOPN, INJECT 0.2 MLS (1.2 MG TOTAL) INTO THE SKIN DAILY. INJECT ONCE DAILY AT THE SAME  TIME, Disp: 6 pen, Rfl: 1 .  lidocaine (XYLOCAINE) 2 % jelly, Apply as needed to affected area, Disp: 30 mL, Rfl: 0 .  Mesalamine 800 MG TBEC, TAKE 3 TABLETS (2,400 MG TOTAL) BY MOUTH 2 (TWO) TIMES DAILY., Disp: 540 tablet, Rfl: 3  Review of Systems:   Review of Systems  Constitutional: Negative for chills and fever.  Gastrointestinal: Negative for abdominal pain, nausea and vomiting.  Genitourinary: Negative for flank pain and hematuria.  Neurological: Negative for headaches.    Vitals:   Vitals:   04/02/18 0947  BP: 130/78  Pulse: 76  Temp: 97.7 F (36.5 C)  TempSrc: Oral  SpO2: 96%  Weight: 178 lb (80.7 kg)  Height: 4' 11"  (1.499 m)     Body mass index is 35.95 kg/m.  Physical Exam:   Physical Exam  Constitutional: She appears well-developed. She is cooperative.  Non-toxic appearance. She does not have a sickly appearance. She does not appear ill. No distress.  Cardiovascular: Normal rate, regular rhythm, S1 normal, S2 normal, normal heart sounds and normal pulses.  No LE edema  Pulmonary/Chest: Effort normal and breath sounds normal.  Genitourinary: Pelvic exam was performed with patient prone. There is rash and tenderness on the right labia. There is rash and tenderness on the left labia.  Genitourinary Comments: Erythematous macerated area to bilateral labia majora and vaginal introitus  Neurological: She is alert. GCS eye subscore is 4. GCS verbal subscore is 5. GCS motor subscore is 6.  Skin: Skin is warm, dry and intact.  Psychiatric: She has a normal mood and affect. Her speech is normal and behavior is normal.  Nursing note and vitals reviewed.   Assessment and Plan:    Aune was seen today for vaginal itching and vaginal burning.  Diagnoses and all orders for this visit:  Labia irritation Also seen by Dr. Briscoe Deutscher. Suspect some sort of dermatitis. Start zinc barrier such as [place] Environmental manager. Keep area dry otherwise. Discussed using  padcicles with Aloe Vanita Ingles and Verlee Monte if needed. Lidocaine jelly prn for uncontrolled pain. Follow-up if symptoms persist.  Other orders -     lidocaine (XYLOCAINE) 2 % jelly; Apply as needed to affected area  . Reviewed expectations re: course of current medical issues. . Discussed self-management of symptoms. . Outlined signs and symptoms indicating need for more acute intervention. . Patient verbalized understanding and all questions were answered. . See orders for this visit as documented in the electronic medical record. . Patient received an After-Visit Summary.  CMA or LPN served as scribe during this visit. History, Physical, and Plan performed by medical provider. The above documentation has been reviewed and is accurate and complete.  Inda Coke, PA-C

## 2018-04-07 ENCOUNTER — Encounter: Payer: Self-pay | Admitting: *Deleted

## 2018-04-21 ENCOUNTER — Telehealth: Payer: Self-pay | Admitting: Gastroenterology

## 2018-04-21 MED ORDER — LOPERAMIDE HCL 2 MG PO TABS: 2.0000 mg | ORAL_TABLET | Freq: Four times a day (QID) | ORAL | 3 refills | Status: AC

## 2018-04-21 NOTE — Telephone Encounter (Signed)
The pt request her imodium to be sent to the pharmacy for cost effectiveness.  She also wanted an appt for follow up with Dr Ardis Hughs.  She was scheduled for first available with Dr Ardis Hughs 06/06/2018.  She states she has diarrhea and has to take 8 imodium daily.  She states this is not new, she has a history of UC.  No other symptoms, no fever, no abd pain, no rectal bleeding.  She was advised to call back if her symptoms worsen or change.

## 2018-04-28 ENCOUNTER — Other Ambulatory Visit: Payer: Self-pay | Admitting: Endocrinology

## 2018-04-29 DIAGNOSIS — K5190 Ulcerative colitis, unspecified, without complications: Secondary | ICD-10-CM | POA: Diagnosis not present

## 2018-04-29 DIAGNOSIS — K219 Gastro-esophageal reflux disease without esophagitis: Secondary | ICD-10-CM | POA: Diagnosis not present

## 2018-05-12 ENCOUNTER — Other Ambulatory Visit (INDEPENDENT_AMBULATORY_CARE_PROVIDER_SITE_OTHER): Payer: BLUE CROSS/BLUE SHIELD

## 2018-05-12 DIAGNOSIS — E669 Obesity, unspecified: Secondary | ICD-10-CM

## 2018-05-12 DIAGNOSIS — E1169 Type 2 diabetes mellitus with other specified complication: Secondary | ICD-10-CM | POA: Diagnosis not present

## 2018-05-12 LAB — COMPREHENSIVE METABOLIC PANEL
ALBUMIN: 3.9 g/dL (ref 3.5–5.2)
ALT: 18 U/L (ref 0–35)
AST: 22 U/L (ref 0–37)
Alkaline Phosphatase: 61 U/L (ref 39–117)
BUN: 23 mg/dL (ref 6–23)
CALCIUM: 9.1 mg/dL (ref 8.4–10.5)
CHLORIDE: 108 meq/L (ref 96–112)
CO2: 27 meq/L (ref 19–32)
CREATININE: 1.16 mg/dL (ref 0.40–1.20)
GFR: 50.05 mL/min — AB (ref >60.00)
Glucose, Bld: 113 mg/dL — ABNORMAL HIGH (ref 70–99)
POTASSIUM: 4.4 meq/L (ref 3.5–5.1)
Sodium: 141 mEq/L (ref 135–145)
Total Bilirubin: 0.5 mg/dL (ref 0.2–1.2)
Total Protein: 6.4 g/dL (ref 6.0–8.3)

## 2018-05-12 LAB — HEMOGLOBIN A1C: Hgb A1c MFr Bld: 6.1 % (ref 4.6–6.5)

## 2018-05-15 ENCOUNTER — Encounter: Payer: Self-pay | Admitting: Endocrinology

## 2018-05-15 ENCOUNTER — Ambulatory Visit (INDEPENDENT_AMBULATORY_CARE_PROVIDER_SITE_OTHER): Payer: BLUE CROSS/BLUE SHIELD | Admitting: Endocrinology

## 2018-05-15 VITALS — BP 110/70 | HR 66 | Ht 59.0 in | Wt 176.0 lb

## 2018-05-15 DIAGNOSIS — E1169 Type 2 diabetes mellitus with other specified complication: Secondary | ICD-10-CM | POA: Diagnosis not present

## 2018-05-15 DIAGNOSIS — Z23 Encounter for immunization: Secondary | ICD-10-CM | POA: Diagnosis not present

## 2018-05-15 DIAGNOSIS — E669 Obesity, unspecified: Secondary | ICD-10-CM

## 2018-05-15 MED ORDER — PNEUMOCOCCAL 13-VAL CONJ VACC IM SUSP: 0.5000 mL | INTRAMUSCULAR | Status: DC

## 2018-05-15 NOTE — Progress Notes (Signed)
Patient ID: Tara Garner, female   DOB: [birth year], 62 y.o.   MRN: [id number]            Reason for Appointment:  Follow-up for Type 2 Diabetes  Referring physician: Briscoe Deutscher   History of Present Illness:          Date of diagnosis of type 2 diabetes mellitus:   2006       Background history:   She was probably treated with metformin initially, she does not remember the details Started insulin in 2007 presumably for poor control She thinks that until a couple of years ago she was followed by an endocrinologist in [place] and then PCP in [place] However A1c would usually be around 8% Also in 2015 she was hospitalized for unknown reasons and developed encephalopathy and diabetic ketoacidosis, was in a nursing home for about a month subsequently and was continued on insulin About a year ago she lost about 70 pounds with cutting back on portions   On her initial consultation she was on basal bolus insulin regimen with tendency to hypoglycemia especially in early morning and after breakfast  Recent history:   Non-insulin hypoglycemic drugs the patient is taking are: Victoza 0.9 mg daily  Her A1c has been very consistent around 6%   Current management, blood sugar patterns and problems identified:  She did not bring her blood sugar meter  Usually checks blood sugars infrequently  She thinks her blood sugars are fairly close to normal in the mornings before breakfast and only as high as 118 after meals  She does tend to have small appetite and does not always get hungry, also has reasonable snacks at times only  Has had done some walking off and on depending on her other health problems and ability to go out walking  Her weight is about the same       Side effects from medications have been: None  Compliance with the medical regimen: Good Hypoglycemia: None recently  Glucose monitoring:  done <1 times a day         Glucometer:  Freestyle .       Blood  Glucose readings by recall:  Morning: 95-108 nonfasting up to 118   Self-care: The diet that the patient has been following is: tries to limit portions and high-fat foods.     Meal times are:  Breakfast is at 9-10 am     Typical meal intake: Breakfast is  variable, sometimes English muffin/bagel, juice.  Lunch lite, fruit; dinner is meat and potato, snacks: Apple cheese crackers               Dietician visit, most recent: Around diagnosis time CDE visit: 6/18               Exercise:  walking at times  Weight history:  maximum weight 260, most of her weight loss was in early 2017   Wt Readings from Last 3 Encounters:  05/15/18 176 lb (79.8 kg)  04/02/18 178 lb (80.7 kg)  03/31/18 178 lb (80.7 kg)    Glycemic control:   Lab Results  Component Value Date   HGBA1C 6.1 05/12/2018   HGBA1C 6.4 01/06/2018   HGBA1C 6.3 12/23/2017   Lab Results  Component Value Date   MICROALBUR <0.7 01/06/2018   LDLCALC 81 09/04/2017   CREATININE 1.16 05/12/2018   Lab Results  Component Value Date   MICRALBCREAT 0.5 01/06/2018    Lab Results  Component Value Date  FRUCTOSAMINE 314 (H) 12/04/2016      Allergies as of 05/15/2018      Reactions   Avelox [moxifloxacin]    Cannot recall   Benicar [olmesartan]    Cannot recall   Ceftin [cefuroxime]    Cannot recall   Codeine    Hallucinations   Imuran [azathioprine]    Instigates cough   Indocin [indomethacin]    Cannot recall reaction   Methotrexate Derivatives    Instigates cough      Medication List       Accurate as of May 15, 2018  1:19 PM. Always use your most recent med list.        albuterol 108 (90 Base) MCG/ACT inhaler Commonly known as:  PROVENTIL HFA;VENTOLIN HFA TAKE 2 PUFFS BY MOUTH EVERY 6 HOURS AS NEEDED FOR WHEEZE OR SHORTNESS OF BREATH   ANTI-DIARRHEAL 2 MG tablet Generic drug:  loperamide Take 2 mg as needed by mouth for diarrhea or loose stools.   loperamide 2 MG tablet Commonly known as:   IMODIUM A-D Take 1 tablet (2 mg total) by mouth 4 (four) times daily.   aspirin EC 81 MG tablet Take 81 mg by mouth daily.   buPROPion 300 MG 24 hr tablet Commonly known as:  WELLBUTRIN XL TAKE 1 TABLET (300 MG TOTAL) DAILY BY MOUTH.   CALCIUM 500/D PO Take 500 mg by mouth daily.   cholecalciferol 1000 units tablet Commonly known as:  VITAMIN D Take 4,000 Units by mouth daily.   COLESTID 1 g tablet Generic drug:  colestipol Take 1 g by mouth 2 (two) times daily.   Cranberry 125 MG Tabs Take 1 tablet by mouth daily.   fluticasone 110 MCG/ACT inhaler Commonly known as:  FLOVENT HFA Inhale 2 puffs into the lungs 2 (two) times daily.   gabapentin 300 MG capsule Commonly known as:  NEURONTIN TAKE 1 CAPSULE BY MOUTH THREE TIMES A DAY   gabapentin 100 MG capsule Commonly known as:  NEURONTIN TAKE 1 CAPSULE BY MOUTH TWICE A DAY   Insulin Pen Needle 32G X 4 MM Misc Commonly known as:  BD PEN NEEDLE NANO U/F USE WITH VICTOZA PEN   lidocaine 2 % jelly Commonly known as:  XYLOCAINE Apply as needed to affected area   Mesalamine 800 MG Tbec TAKE 3 TABLETS (2,400 MG TOTAL) BY MOUTH 2 (TWO) TIMES DAILY.   metoprolol succinate 100 MG 24 hr tablet Commonly known as:  TOPROL-XL TAKE 1 TABLET (100 MG TOTAL) BY MOUTH DAILY. TAKE WITH OR IMMEDIATELY FOLLOWING A MEAL.   montelukast 10 MG tablet Commonly known as:  SINGULAIR TAKE 1 TABLET BY MOUTH EVERYDAY AT BEDTIME   multivitamin tablet Take 1 tablet daily by mouth.   pantoprazole 40 MG tablet Commonly known as:  PROTONIX Take 1 tablet (40 mg total) by mouth 2 (two) times daily.   ranitidine 150 MG tablet Commonly known as:  ZANTAC TAKE 2 TABLETS (300 MG TOTAL) BY MOUTH AT BEDTIME   VICTOZA 18 MG/3ML Sopn Generic drug:  liraglutide INJECT 0.2 MLS (1.2 MG TOTAL) INTO THE SKIN DAILY. INJECT ONCE DAILY AT THE SAME TIME       Allergies:  Allergies  Allergen Reactions  . Avelox [Moxifloxacin]     Cannot recall  .  Benicar [Olmesartan]     Cannot recall  . Ceftin [Cefuroxime]     Cannot recall  . Codeine     Hallucinations  . Imuran [Azathioprine]     Instigates cough  .  Indocin [Indomethacin]     Cannot recall reaction  . Methotrexate Derivatives     Instigates cough    Past Medical History:  Diagnosis Date  . Acid reflux   . Asthma   . Cough due to ACE inhibitor   . Depression, recurrent ([place]) 09/16/2016  . Diabetes mellitus without complication ([place])   . DVT (deep venous thrombosis) ([place])   . Hypertension   . Morbid obesity ([place]) 09/16/2016  . Seasonal allergic rhinitis due to pollen 09/16/2016  . Ulcerative colitis [facility])     Past Surgical History:  Procedure Laterality Date  . ABDOMINAL HYSTERECTOMY    . BREAST BIOPSY Left    benign  . CATARACT EXTRACTION    . CESAREAN SECTION    . COLONOSCOPY  2012,2015,2016   Dr.Andreson and in [place]   . KNEE SURGERY Left   . SHOULDER SURGERY Right     Family History  Problem Relation Age of Onset  . Diabetes Mother   . Dementia Mother   . Stroke Mother   . Coronary artery disease Mother   . Cancer Brother   . Diverticulitis Brother   . Colon cancer Brother 32  . COPD Father   . Breast cancer Cousin   . Rectal cancer Neg Hx   . Stomach cancer Neg Hx     Social History:  reports that she has never smoked. She has never used smokeless tobacco. She reports that she does not drink alcohol or use drugs.   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  No history of retinopathy or nephropathy    Physical Examination:  BP 110/70 (BP Location: Left Arm, Patient Position: Sitting, Cuff Size: Normal)   Pulse 66   Ht 4' 11"  (1.499 m)   Wt 176 lb (79.8 kg)   SpO2 99%   BMI 35.55 kg/m      ASSESSMENT:  Diabetes type 2, with obesity  See history of present illness for  discussion of current diabetes management, blood sugar patterns and problems identified   A1c is consistent at 6.1%  She has been treated with Victoza, currently using 0.9 mg  She has fairly good blood sugars at home, mostly near normal Victoza has helped her maintain her weight and she does have suppression of her appetite  RENAL dysfunction: Improved  She is unclear about when she had her Pneumovax and likely has not had Prevnar  PLAN:    No change in Victoza dose noted She will continue to check blood sugars periodically and call if any higher Prevnar given   There are no Patient Instructions on file for this visit.   Elayne Snare 05/15/2018, 1:19 PM   Note: This office note was prepared with Dragon voice recognition system technology. Any transcriptional errors that result from this process are unintentional.

## 2018-05-15 NOTE — Patient Instructions (Signed)
Walk daily  Check blood sugars on waking up 1-2 days a week  Also check blood sugars about 2 hours after meals and do this after different meals by rotation  Recommended blood sugar levels on waking up are 90-130 and about 2 hours after meal is 130-160  Please bring your blood sugar monitor to each visit, thank you

## 2018-05-30 ENCOUNTER — Ambulatory Visit (INDEPENDENT_AMBULATORY_CARE_PROVIDER_SITE_OTHER): Payer: BLUE CROSS/BLUE SHIELD | Admitting: Physician Assistant

## 2018-05-30 ENCOUNTER — Other Ambulatory Visit: Payer: Self-pay | Admitting: Family Medicine

## 2018-05-30 ENCOUNTER — Encounter: Payer: Self-pay | Admitting: Physician Assistant

## 2018-05-30 VITALS — BP 130/80 | HR 73 | Temp 97.7°F | Ht 59.0 in | Wt 177.0 lb

## 2018-05-30 DIAGNOSIS — R3915 Urgency of urination: Secondary | ICD-10-CM | POA: Diagnosis not present

## 2018-05-30 DIAGNOSIS — R300 Dysuria: Secondary | ICD-10-CM

## 2018-05-30 LAB — POCT URINALYSIS DIPSTICK
Bilirubin, UA: NEGATIVE
Blood, UA: NEGATIVE
GLUCOSE UA: NEGATIVE
Ketones, UA: NEGATIVE
Nitrite, UA: NEGATIVE
Protein, UA: NEGATIVE
Spec Grav, UA: 1.015 (ref 1.010–1.025)
Urobilinogen, UA: 0.2 E.U./dL
pH, UA: 6 (ref 5.0–8.0)

## 2018-05-30 MED ORDER — CIPROFLOXACIN HCL 250 MG PO TABS: 250.0000 mg | ORAL_TABLET | Freq: Two times a day (BID) | ORAL | 0 refills | Status: DC

## 2018-05-30 NOTE — Patient Instructions (Signed)
It was great to see you!  Start oral cipro antibiotic. We will reach out as soon as your culture returns.  Take care,  Inda Coke PA-C   Urinary Tract Infection, Adult A urinary tract infection (UTI) is an infection of any part of the urinary tract. The urinary tract includes:  The kidneys.  The ureters.  The bladder.  The urethra. These organs make, store, and get rid of pee (urine) in the body. What are the causes? This is caused by germs (bacteria) in your genital area. These germs grow and cause swelling (inflammation) of your urinary tract. What increases the risk? You are more likely to develop this condition if:  You have a small, thin tube (catheter) to drain pee.  You cannot control when you pee or poop (incontinence).  You are female, and: ? You use these methods to prevent pregnancy: ? A medicine that kills sperm (spermicide). ? A device that blocks sperm (diaphragm). ? You have low levels of a female hormone (estrogen). ? You are pregnant.  You have genes that add to your risk.  You are sexually active.  You take antibiotic medicines.  You have trouble peeing because of: ? A prostate that is bigger than normal, if you are female. ? A blockage in the part of your body that drains pee from the bladder (urethra). ? A kidney stone. ? A nerve condition that affects your bladder (neurogenic bladder). ? Not getting enough to drink. ? Not peeing often enough.  You have other conditions, such as: ? Diabetes. ? A weak disease-fighting system (immune system). ? Sickle cell disease. ? Gout. ? Injury of the spine. What are the signs or symptoms? Symptoms of this condition include:  Needing to pee right away (urgently).  Peeing often.  Peeing small amounts often.  Pain or burning when peeing.  Blood in the pee.  Pee that smells bad or not like normal.  Trouble peeing.  Pee that is cloudy.  Fluid coming from the vagina, if you are  female.  Pain in the belly or lower back. Other symptoms include:  Throwing up (vomiting).  No urge to eat.  Feeling mixed up (confused).  Being tired and grouchy (irritable).  A fever.  Watery poop (diarrhea). How is this treated? This condition may be treated with:  Antibiotic medicine.  Other medicines.  Drinking enough water. Follow these instructions at home:  Medicines  Take over-the-counter and prescription medicines only as told by your doctor.  If you were prescribed an antibiotic medicine, take it as told by your doctor. Do not stop taking it even if you start to feel better. General instructions  Make sure you: ? Pee until your bladder is empty. ? Do not hold pee for a long time. ? Empty your bladder after sex. ? Wipe from front to back after pooping if you are a female. Use each tissue one time when you wipe.  Drink enough fluid to keep your pee pale yellow.  Keep all follow-up visits as told by your doctor. This is important. Contact a doctor if:  You do not get better after 1-2 days.  Your symptoms go away and then come back. Get help right away if:  You have very bad back pain.  You have very bad pain in your lower belly.  You have a fever.  You are sick to your stomach (nauseous).  You are throwing up. Summary  A urinary tract infection (UTI) is an infection of any part  of the urinary tract.  This condition is caused by germs in your genital area.  There are many risk factors for a UTI. These include having a small, thin tube to drain pee and not being able to control when you pee or poop.  Treatment includes antibiotic medicines for germs.  Drink enough fluid to keep your pee pale yellow. This information is not intended to replace advice given to you by your health care provider. Make sure you discuss any questions you have with your health care provider. Document Released: 10/31/2007 Document Revised: 11/21/2017 Document  Reviewed: 11/21/2017 Elsevier Interactive Patient Education  2019 [employer].

## 2018-05-30 NOTE — Progress Notes (Signed)
Tara Garner is a 63 y.o. female here for a new problem.  I acted as a Education administrator for [employer], PA-C Anselmo Pickler, LPN   History of Present Illness:   Chief Complaint  Patient presents with  . Urinary Tract Infection    Urinary Tract Infection   This is a new problem. Episode onset: Started Tuesday. The problem occurs every urination. The problem has been gradually worsening. The quality of the pain is described as burning (Pressure). The pain is at a severity of 6/10. The pain is moderate. There has been no fever. She is sexually active. There is no history of pyelonephritis. Associated symptoms include frequency and urgency. Pertinent negatives include no chills, discharge, flank pain, hematuria, nausea or vomiting. She has tried increased fluids for the symptoms. The treatment provided no relief. Her past medical history is significant for recurrent UTIs.   Recently drove to and from [place] for the holidays, had to hold bladder longer than usual.   Past Medical History:  Diagnosis Date  . Acid reflux   . Asthma   . Cough due to ACE inhibitor   . Depression, recurrent ([place]) 09/16/2016  . Diabetes mellitus without complication ([place])   . DVT (deep venous thrombosis) ([place])   . Hypertension   . Morbid obesity ([place]) 09/16/2016  . Seasonal allergic rhinitis due to pollen 09/16/2016  . Ulcerative colitis ([place])      Social History   Socioeconomic History  . Marital status: Married    Spouse name: Not on file  . Number of children: Not on file  . Years of education: Not on file  . Highest education level: Not on file  Occupational History  . Not on file  Social Needs  . Financial resource strain: Not on file  . Food insecurity:    Worry: Not on file    Inability: Not on file  . Transportation needs:    Medical: Not on file    Non-medical: Not on file  Tobacco Use  . Smoking status: Never Smoker  . Smokeless tobacco: Never Used  Substance and Sexual Activity  . Alcohol  use: No  . Drug use: No  . Sexual activity: Not on file  Lifestyle  . Physical activity:    Days per week: Not on file    Minutes per session: Not on file  . Stress: Not on file  Relationships  . Social connections:    Talks on phone: Not on file    Gets together: Not on file    Attends religious service: Not on file    Active member of club or organization: Not on file    Attends meetings of clubs or organizations: Not on file    Relationship status: Not on file  . Intimate partner violence:    Fear of current or ex partner: Not on file    Emotionally abused: Not on file    Physically abused: Not on file    Forced sexual activity: Not on file  Other Topics Concern  . Not on file  Social History Narrative  . Not on file    Past Surgical History:  Procedure Laterality Date  . ABDOMINAL HYSTERECTOMY    . BREAST BIOPSY Left    benign  . CATARACT EXTRACTION    . CESAREAN SECTION    . COLONOSCOPY  2012,2015,2016   Dr.Andreson and in [place]   . KNEE SURGERY Left   . SHOULDER SURGERY Right     Family History  Problem Relation Age of Onset  . Diabetes Mother   . Dementia Mother   . Stroke Mother   . Coronary artery disease Mother   . Cancer Brother   . Diverticulitis Brother   . Colon cancer Brother 76  . COPD Father   . Breast cancer Cousin   . Rectal cancer Neg Hx   . Stomach cancer Neg Hx     Allergies  Allergen Reactions  . Avelox [Moxifloxacin]     Cannot recall  . Benicar [Olmesartan]     Cannot recall  . Ceftin [Cefuroxime]     Cannot recall  . Codeine     Hallucinations  . Imuran [Azathioprine]     Instigates cough  . Indocin [Indomethacin]     Cannot recall reaction  . Methotrexate Derivatives     Instigates cough    Current Medications:   Current Outpatient Medications:  .  albuterol (PROVENTIL HFA;VENTOLIN HFA) 108 (90 Base) MCG/ACT inhaler, TAKE 2 PUFFS BY MOUTH EVERY 6 HOURS AS NEEDED FOR WHEEZE OR SHORTNESS OF BREATH, Disp: 8.5  Inhaler, Rfl: 2 .  aspirin EC 81 MG tablet, Take 81 mg by mouth daily., Disp: , Rfl:  .  buPROPion (WELLBUTRIN XL) 300 MG 24 hr tablet, TAKE 1 TABLET (300 MG TOTAL) DAILY BY MOUTH., Disp: 90 tablet, Rfl: 1 .  Calcium Carbonate-Vitamin D (CALCIUM 500/D PO), Take 500 mg by mouth daily., Disp: , Rfl:  .  cholecalciferol (VITAMIN D) 1000 units tablet, Take 4,000 Units by mouth daily., Disp: , Rfl:  .  colestipol (COLESTID) 1 g tablet, Take 1 g by mouth 2 (two) times daily., Disp: , Rfl:  .  Cranberry 125 MG TABS, Take 1 tablet by mouth daily., Disp: , Rfl:  .  fluticasone (FLOVENT HFA) 110 MCG/ACT inhaler, Inhale 2 puffs into the lungs 2 (two) times daily., Disp: 1 Inhaler, Rfl: 12 .  gabapentin (NEURONTIN) 100 MG capsule, TAKE 1 CAPSULE BY MOUTH TWICE A DAY, Disp: 180 capsule, Rfl: 2 .  gabapentin (NEURONTIN) 300 MG capsule, TAKE 1 CAPSULE BY MOUTH THREE TIMES A DAY (Patient taking differently: Take 1 tablet by mouth at bedtime.), Disp: 360 capsule, Rfl: 1 .  Insulin Pen Needle (BD PEN NEEDLE NANO U/F) 32G X 4 MM MISC, USE WITH VICTOZA PEN, Disp: 100 each, Rfl: 2 .  lidocaine (XYLOCAINE) 2 % jelly, Apply as needed to affected area, Disp: 30 mL, Rfl: 0 .  loperamide (ANTI-DIARRHEAL) 2 MG tablet, Take 2 mg as needed by mouth for diarrhea or loose stools., Disp: , Rfl:  .  metoprolol succinate (TOPROL-XL) 100 MG 24 hr tablet, TAKE 1 TABLET (100 MG TOTAL) BY MOUTH DAILY. TAKE WITH OR IMMEDIATELY FOLLOWING A MEAL., Disp: 90 tablet, Rfl: 1 .  montelukast (SINGULAIR) 10 MG tablet, TAKE 1 TABLET BY MOUTH EVERYDAY AT BEDTIME, Disp: 90 tablet, Rfl: 1 .  Multiple Vitamin (MULTIVITAMIN) tablet, Take 1 tablet daily by mouth., Disp: , Rfl:  .  ranitidine (ZANTAC) 150 MG tablet, TAKE 2 TABLETS (300 MG TOTAL) BY MOUTH AT BEDTIME, Disp: 180 tablet, Rfl: 3 .  VICTOZA 18 MG/3ML SOPN, INJECT 0.2 MLS (1.2 MG TOTAL) INTO THE SKIN DAILY. INJECT ONCE DAILY AT THE SAME TIME, Disp: 6 pen, Rfl: 1 .  ciprofloxacin (CIPRO) 250 MG  tablet, Take 1 tablet (250 mg total) by mouth 2 (two) times daily for 5 days., Disp: 10 tablet, Rfl: 0 .  Mesalamine 800 MG TBEC, TAKE 3 TABLETS (2,400 MG TOTAL) BY MOUTH 2 (  TWO) TIMES DAILY., Disp: 540 tablet, Rfl: 3 .  pantoprazole (PROTONIX) 40 MG tablet, Take 1 tablet (40 mg total) by mouth 2 (two) times daily., Disp: 180 tablet, Rfl: 3   Review of Systems:   Review of Systems  Constitutional: Negative for chills.  Gastrointestinal: Negative for nausea and vomiting.  Genitourinary: Positive for frequency and urgency. Negative for flank pain and hematuria.    Vitals:   Vitals:   05/30/18 1302  BP: 130/80  Pulse: 73  Temp: 97.7 F (36.5 C)  TempSrc: Oral  SpO2: 98%  Weight: 177 lb (80.3 kg)  Height: 4' 11"  (1.499 m)     Body mass index is 35.75 kg/m.  Physical Exam:   Physical Exam Vitals signs and nursing note reviewed.  Constitutional:      General: She is not in acute distress.    Appearance: Normal appearance. She is well-developed. She is not ill-appearing or toxic-appearing.  Cardiovascular:     Rate and Rhythm: Normal rate and regular rhythm.     Pulses: Normal pulses.     Heart sounds: Normal heart sounds, S1 normal and S2 normal.  Pulmonary:     Effort: Pulmonary effort is normal.     Breath sounds: Normal breath sounds.  Abdominal:     General: Bowel sounds are normal.     Tenderness: There is no abdominal tenderness.  Skin:    General: Skin is warm and dry.  Neurological:     Mental Status: She is alert.     GCS: GCS eye subscore is 4. GCS verbal subscore is 5. GCS motor subscore is 6.  Psychiatric:        Speech: Speech normal.        Behavior: Behavior normal. Behavior is cooperative.     Results for orders placed or performed in visit on 05/30/18  POCT urinalysis dipstick  Result Value Ref Range   Color, UA yellow    Clarity, UA clear    Glucose, UA Negative Negative   Bilirubin, UA Negative    Ketones, UA Negative    Spec Grav, UA 1.015  1.010 - 1.025   Blood, UA Negative    pH, UA 6.0 5.0 - 8.0   Protein, UA Negative Negative   Urobilinogen, UA 0.2 0.2 or 1.0 E.U./dL   Nitrite, UA Negative    Leukocytes, UA Large (3+) (A) Negative   Appearance     Odor      Assessment and Plan:   Tara Garner was seen today for urinary tract infection.  Diagnoses and all orders for this visit:  Dysuria; Urgency of urination UA concerning for cystitis. Will start cipro. Culture sent. Will update based on culture. Return precautions advised. -     POCT urinalysis dipstick -     Urine Culture  Other orders -     ciprofloxacin (CIPRO) 250 MG tablet; Take 1 tablet (250 mg total) by mouth 2 (two) times daily for 5 days.    . Reviewed expectations re: course of current medical issues. . Discussed self-management of symptoms. . Outlined signs and symptoms indicating need for more acute intervention. . Patient verbalized understanding and all questions were answered. . See orders for this visit as documented in the electronic medical record. . Patient received an After-Visit Summary.  CMA or LPN served as scribe during this visit. History, Physical, and Plan performed by medical provider. The above documentation has been reviewed and is accurate and complete.   Inda Coke, PA-C

## 2018-06-01 LAB — URINE CULTURE
MICRO NUMBER:: 8822
SPECIMEN QUALITY:: ADEQUATE

## 2018-06-02 ENCOUNTER — Other Ambulatory Visit: Payer: Self-pay | Admitting: Physician Assistant

## 2018-06-02 MED ORDER — AMOXICILLIN-POT CLAVULANATE 875-125 MG PO TABS: 1.0000 | ORAL_TABLET | Freq: Two times a day (BID) | ORAL | 0 refills | Status: DC

## 2018-06-06 ENCOUNTER — Telehealth: Payer: Self-pay | Admitting: *Deleted

## 2018-06-06 ENCOUNTER — Ambulatory Visit: Payer: BLUE CROSS/BLUE SHIELD | Admitting: Gastroenterology

## 2018-06-06 NOTE — Telephone Encounter (Signed)
Spoke to pt told her I am sorry did not call earlier. Pt said she thinks she is having a reaction to the Augmentin she was prescribed. Pt said she is having pain in both arms, shoulders to hands and is having trouble using her hands. Pt said she stopped the medication last night and would like something else for UTI. Told pt she will need to be evaluated regarding arm and hand pain can go to Urgent care or the ER. Pt said she refuses to go to Urgent care or ER pt said she will see if stopping the medication helps by tomorrow. Told pt I can schedule an appt to be seen at our Saturday clinic at the [place] office. Pt said no she will wait. Told pt to call first thing in the morning if she wants to be seen only open from 9:00 to 1:00. Pt verbalized understanding. Told pt I can not give her another antibiotic without being seen per [facility]. Pt verbalized understanding. Asked pt if still having symptoms? Pt said frequency and urgency. Asked pt if having any SOB? Pt said no. Told pt if symptoms worsen need to go to ER. Pt verbalized understanding.

## 2018-06-06 NOTE — Telephone Encounter (Signed)
Pt calling back she didn't know if provider wanted her to make a appt or just speak with her please call at [phone]

## 2018-06-06 NOTE — Telephone Encounter (Signed)
Copied from Christine [id number]. Topic: General - Other >> Jun 06, 2018  8:07 AM Conception Chancy, NT wrote: Reason for CRM: patient is calling and states she was seen on 05/30/18 and she was supposed to contact Inda Coke if she was not better and patient states she is not better and the medicine she was changed to on  06/02/18 has made her arms weak. Please advise.

## 2018-06-06 NOTE — Telephone Encounter (Signed)
Samantha please see message and advise. 

## 2018-06-11 ENCOUNTER — Other Ambulatory Visit: Payer: Self-pay | Admitting: Family Medicine

## 2018-07-07 ENCOUNTER — Ambulatory Visit (INDEPENDENT_AMBULATORY_CARE_PROVIDER_SITE_OTHER): Payer: BLUE CROSS/BLUE SHIELD | Admitting: Family Medicine

## 2018-07-07 ENCOUNTER — Encounter: Payer: Self-pay | Admitting: Family Medicine

## 2018-07-07 VITALS — BP 138/86 | HR 67 | Temp 97.5°F | Ht 59.0 in | Wt 175.4 lb

## 2018-07-07 DIAGNOSIS — N3000 Acute cystitis without hematuria: Secondary | ICD-10-CM

## 2018-07-07 LAB — POCT URINALYSIS DIPSTICK
Bilirubin, UA: NEGATIVE
Blood, UA: NEGATIVE
Glucose, UA: NEGATIVE
Ketones, UA: NEGATIVE
Nitrite, UA: NEGATIVE
Protein, UA: NEGATIVE
Spec Grav, UA: 1.02 (ref 1.010–1.025)
Urobilinogen, UA: 0.2 E.U./dL
pH, UA: 5.5 (ref 5.0–8.0)

## 2018-07-07 MED ORDER — CIPROFLOXACIN HCL 250 MG PO TABS: 250.0000 mg | ORAL_TABLET | Freq: Two times a day (BID) | ORAL | 0 refills | Status: DC

## 2018-07-07 NOTE — Patient Instructions (Signed)
cipro one pill twice a day x 3 days   Urinary Tract Infection, Adult A urinary tract infection (UTI) is an infection of any part of the urinary tract. The urinary tract includes:  The kidneys.  The ureters.  The bladder.  The urethra. These organs make, store, and get rid of pee (urine) in the body. What are the causes? This is caused by germs (bacteria) in your genital area. These germs grow and cause swelling (inflammation) of your urinary tract. What increases the risk? You are more likely to develop this condition if:  You have a small, thin tube (catheter) to drain pee.  You cannot control when you pee or poop (incontinence).  You are female, and: ? You use these methods to prevent pregnancy: ? A medicine that kills sperm (spermicide). ? A device that blocks sperm (diaphragm). ? You have low levels of a female hormone (estrogen). ? You are pregnant.  You have genes that add to your risk.  You are sexually active.  You take antibiotic medicines.  You have trouble peeing because of: ? A prostate that is bigger than normal, if you are female. ? A blockage in the part of your body that drains pee from the bladder (urethra). ? A kidney stone. ? A nerve condition that affects your bladder (neurogenic bladder). ? Not getting enough to drink. ? Not peeing often enough.  You have other conditions, such as: ? Diabetes. ? A weak disease-fighting system (immune system). ? Sickle cell disease. ? Gout. ? Injury of the spine. What are the signs or symptoms? Symptoms of this condition include:  Needing to pee right away (urgently).  Peeing often.  Peeing small amounts often.  Pain or burning when peeing.  Blood in the pee.  Pee that smells bad or not like normal.  Trouble peeing.  Pee that is cloudy.  Fluid coming from the vagina, if you are female.  Pain in the belly or lower back. Other symptoms include:  Throwing up (vomiting).  No urge to  eat.  Feeling mixed up (confused).  Being tired and grouchy (irritable).  A fever.  Watery poop (diarrhea). How is this treated? This condition may be treated with:  Antibiotic medicine.  Other medicines.  Drinking enough water. Follow these instructions at home:  Medicines  Take over-the-counter and prescription medicines only as told by your doctor.  If you were prescribed an antibiotic medicine, take it as told by your doctor. Do not stop taking it even if you start to feel better. General instructions  Make sure you: ? Pee until your bladder is empty. ? Do not hold pee for a long time. ? Empty your bladder after sex. ? Wipe from front to back after pooping if you are a female. Use each tissue one time when you wipe.  Drink enough fluid to keep your pee pale yellow.  Keep all follow-up visits as told by your doctor. This is important. Contact a doctor if:  You do not get better after 1-2 days.  Your symptoms go away and then come back. Get help right away if:  You have very bad back pain.  You have very bad pain in your lower belly.  You have a fever.  You are sick to your stomach (nauseous).  You are throwing up. Summary  A urinary tract infection (UTI) is an infection of any part of the urinary tract.  This condition is caused by germs in your genital area.  There are many  risk factors for a UTI. These include having a small, thin tube to drain pee and not being able to control when you pee or poop.  Treatment includes antibiotic medicines for germs.  Drink enough fluid to keep your pee pale yellow. This information is not intended to replace advice given to you by your health care provider. Make sure you discuss any questions you have with your health care provider. Document Released: 10/31/2007 Document Revised: 11/21/2017 Document Reviewed: 11/21/2017 Elsevier Interactive Patient Education  2019 [employer].

## 2018-07-07 NOTE — Progress Notes (Signed)
Patient: Tara Garner MRN: [id number] DOB: 02/20/1956 PCP: Briscoe Deutscher, DO     Subjective:  Chief Complaint  Patient presents with  . Urinary Frequency  . pelvic pressure    HPI: The patient is a 63 y.o. female who presents today for uti symptoms. She states she seems to be getting more frequent UTIs recently. She had a +culture UTI at beginning of January. Symptoms started about 2 days ago on Saturday night. She had increased frequency and urgency. She has really uncomfortable pressure when she is urinating and urine is cloudy. Also has a smell to it. No dysuria at this point. No visible blood. No flank pain. She has not had fever, but had chills yesterday. NO N/V/D. She has had a stomach ache. She is still sexually active. She actually had sex on Saturday.  She thinks she has had 4-5 this past year. She does have a hx of diabetes. Last a1c was 6.1. gfr 50  Review of Systems  Constitutional: Positive for chills. Negative for fatigue and fever.  Gastrointestinal: Positive for abdominal pain.  Genitourinary: Positive for frequency and pelvic pain. Negative for dysuria and flank pain.  Musculoskeletal: Negative for back pain.    Allergies Patient is allergic to augmentin [amoxicillin-pot clavulanate]; avelox [moxifloxacin]; benicar [olmesartan]; ceftin [cefuroxime]; codeine; imuran [azathioprine]; indocin [indomethacin]; and methotrexate derivatives.  Past Medical History Patient  has a past medical history of Acid reflux, Asthma, Cough due to ACE inhibitor, Depression, recurrent ([place]) (09/16/2016), Diabetes mellitus without complication ([place]), DVT (deep venous thrombosis) ([place]), Hypertension, Morbid obesity ([place]) (09/16/2016), Seasonal allergic rhinitis due to pollen (09/16/2016), and Ulcerative colitis ([place]).  Surgical History Patient  has a past surgical history that includes Cesarean section; Abdominal hysterectomy; Shoulder surgery (Right); Knee surgery (Left); Cataract  extraction; Breast biopsy (Left); and Colonoscopy (2012,2015,2016).  Family History Pateint's family history includes Breast cancer in her cousin; COPD in her father; Cancer in her brother; Colon cancer (age of onset: 55) in her brother; Coronary artery disease in her mother; Dementia in her mother; Diabetes in her mother; Diverticulitis in her brother; Stroke in her mother.  Social History Patient  reports that she has never smoked. She has never used smokeless tobacco. She reports that she does not drink alcohol or use drugs.    Objective: Vitals:   07/07/18 1507  BP: 138/86  Pulse: 67  Temp: (!) 97.5 F (36.4 C)  TempSrc: Oral  SpO2: 97%  Weight: 175 lb 6.4 oz (79.6 kg)  Height: 4' 11"  (1.499 m)    Body mass index is 35.43 kg/m.  Physical Exam Vitals signs reviewed.  Constitutional:      Appearance: She is obese.  Neck:     Musculoskeletal: Normal range of motion and neck supple.  Cardiovascular:     Rate and Rhythm: Normal rate and regular rhythm.     Heart sounds: Normal heart sounds.  Pulmonary:     Effort: Pulmonary effort is normal.     Breath sounds: Normal breath sounds.  Abdominal:     General: Abdomen is flat. Bowel sounds are normal.     Palpations: Abdomen is soft.     Tenderness: There is abdominal tenderness (suprapubic ). There is no right CVA tenderness or left CVA tenderness.  Neurological:     Mental Status: She is alert.       UA: 3+ LE  Assessment/plan: 1. Acute cystitis without hematuria Symptoms and ua consistent with last +UTI. Putting her on cipro x 3 days. Tolerated just  fine in the past. If resistant again to cipro options limited due to allergies and discussed we could do a short course of macrobid, GFR 50 and studies have shown safe for short course if GFR 30-50 vs. Rocephin. Also has had multiple UTIs and could be related to sex. She is going to keep a log regarding this. Dicussed will need to discuss with pcp about urology referral if  continues to get UTIs vs abx prophylactically with sex.  - POCT Urinalysis Dipstick - Urine Culture      Return if symptoms worsen or fail to improve.   Orma Flaming, MD [place]   07/07/2018

## 2018-07-08 LAB — URINE CULTURE
MICRO NUMBER:: 173365
SPECIMEN QUALITY:: ADEQUATE

## 2018-08-11 ENCOUNTER — Other Ambulatory Visit: Payer: Self-pay | Admitting: Family Medicine

## 2018-09-11 ENCOUNTER — Other Ambulatory Visit: Payer: Self-pay | Admitting: Family Medicine

## 2018-09-11 ENCOUNTER — Other Ambulatory Visit: Payer: Self-pay | Admitting: Endocrinology

## 2018-09-11 NOTE — Telephone Encounter (Signed)
Last OV 07/07/2018 Last refill 03/31/2018 #90/1 Next OV not scheduled

## 2018-09-15 ENCOUNTER — Other Ambulatory Visit: Payer: BLUE CROSS/BLUE SHIELD

## 2018-09-18 ENCOUNTER — Ambulatory Visit: Payer: BLUE CROSS/BLUE SHIELD | Admitting: Endocrinology

## 2018-09-22 DIAGNOSIS — K9089 Other intestinal malabsorption: Secondary | ICD-10-CM | POA: Diagnosis not present

## 2018-09-22 DIAGNOSIS — K219 Gastro-esophageal reflux disease without esophagitis: Secondary | ICD-10-CM | POA: Diagnosis not present

## 2018-09-22 DIAGNOSIS — K5190 Ulcerative colitis, unspecified, without complications: Secondary | ICD-10-CM | POA: Diagnosis not present

## 2018-10-28 ENCOUNTER — Other Ambulatory Visit: Payer: Self-pay | Admitting: Family Medicine

## 2018-10-28 DIAGNOSIS — Z1231 Encounter for screening mammogram for malignant neoplasm of breast: Secondary | ICD-10-CM

## 2018-11-06 ENCOUNTER — Ambulatory Visit: Payer: BLUE CROSS/BLUE SHIELD

## 2018-11-21 DIAGNOSIS — Z20828 Contact with and (suspected) exposure to other viral communicable diseases: Secondary | ICD-10-CM | POA: Diagnosis not present

## 2018-11-27 ENCOUNTER — Other Ambulatory Visit: Payer: Self-pay

## 2018-11-27 ENCOUNTER — Other Ambulatory Visit: Payer: Self-pay | Admitting: Family Medicine

## 2018-11-27 NOTE — Telephone Encounter (Signed)
Follow up appointment made.  Rx sent in for 30day.

## 2018-11-27 NOTE — Telephone Encounter (Signed)
Okay refill x 1 month. Schedule follow up since it has been so long since I've seen her.

## 2018-11-27 NOTE — Telephone Encounter (Signed)
Rx request Last fill 03/20/18  #180/2 Last OV w/[facility] 07/07/18

## 2018-12-06 ENCOUNTER — Other Ambulatory Visit: Payer: Self-pay | Admitting: Endocrinology

## 2018-12-06 ENCOUNTER — Other Ambulatory Visit: Payer: Self-pay | Admitting: Family Medicine

## 2018-12-08 ENCOUNTER — Other Ambulatory Visit: Payer: Self-pay

## 2018-12-08 ENCOUNTER — Telehealth: Payer: Self-pay | Admitting: Family Medicine

## 2018-12-08 MED ORDER — BUPROPION HCL ER (XL) 300 MG PO TB24: 300.0000 mg | ORAL_TABLET | Freq: Every day | ORAL | 1 refills | Status: DC

## 2018-12-08 NOTE — Telephone Encounter (Signed)
Medication Refill - Medication: buPROPion (WELLBUTRIN XL) 300 MG 24 hr tablet, montelukast (SINGULAIR) 10 MG tablet  Has the patient contacted their pharmacy? Yes, pt is out of town and needs additional 5 pills for each to last her until she comes back to [facility].  (Agent: If no, request that the patient contact the pharmacy for the refill.) (Agent: If yes, when and what did the pharmacy advise?)  Preferred Pharmacy (with phone number or street name): [facility], [address], [place] [address]  Phone: [phone]  Agent: Please be advised that RX refills may take up to 3 business days. We ask that you follow-up with your pharmacy.

## 2018-12-08 NOTE — Telephone Encounter (Signed)
Rx sent to pharmacy preferred.  Patient informed.

## 2018-12-08 NOTE — Telephone Encounter (Signed)
See note

## 2018-12-10 ENCOUNTER — Other Ambulatory Visit: Payer: Self-pay

## 2018-12-10 MED ORDER — MONTELUKAST SODIUM 10 MG PO TABS: ORAL_TABLET | ORAL | 0 refills | Status: DC

## 2018-12-22 ENCOUNTER — Encounter: Payer: Self-pay | Admitting: Internal Medicine

## 2018-12-22 ENCOUNTER — Other Ambulatory Visit: Payer: Self-pay

## 2018-12-22 ENCOUNTER — Ambulatory Visit (INDEPENDENT_AMBULATORY_CARE_PROVIDER_SITE_OTHER): Payer: BC Managed Care – PPO | Admitting: Internal Medicine

## 2018-12-22 VITALS — BP 128/70 | HR 80 | Temp 97.6°F | Wt 183.6 lb

## 2018-12-22 DIAGNOSIS — Z7982 Long term (current) use of aspirin: Secondary | ICD-10-CM

## 2018-12-22 DIAGNOSIS — S61012A Laceration without foreign body of left thumb without damage to nail, initial encounter: Secondary | ICD-10-CM

## 2018-12-22 NOTE — Patient Instructions (Addendum)
Fu as needed for  signs infection  . Will take  7-14 days to heal

## 2018-12-22 NOTE — Progress Notes (Signed)
Chief Complaint  Patient presents with  . cut on thumb    L thumb, happened yesterday while cutting an onion, stopped bleeding this am    HPI: Tara Garner 63 y.o.  SDA  appt  PCP NA?  Is right handed using sharp knife  Cutting onions for quesidillas   Yesterday afternoon and sliced distal thumb ulnar side near ail oozzing blood off and on since then elevation last night  Stopped bleeding a fe hours ago . Sore like a bruise but no fever    Had cleaned it pretty well.    Takes  asa for no specific indication but on for years.  ROS: See pertinent positives and negatives per HPI.  Past Medical History:  Diagnosis Date  . Acid reflux   . Asthma   . Cough due to ACE inhibitor   . Depression, recurrent ([place]) 09/16/2016  . Diabetes mellitus without complication ([place])   . DVT (deep venous thrombosis) ([place])   . Hypertension   . Morbid obesity ([place]) 09/16/2016  . Seasonal allergic rhinitis due to pollen 09/16/2016  . Ulcerative colitis ([place])     Family History  Problem Relation Age of Onset  . Diabetes Mother   . Dementia Mother   . Stroke Mother   . Coronary artery disease Mother   . Cancer Brother   . Diverticulitis Brother   . Colon cancer Brother 33  . COPD Father   . Breast cancer Cousin   . Rectal cancer Neg Hx   . Stomach cancer Neg Hx     Social History   Socioeconomic History  . Marital status: Married    Spouse name: Not on file  . Number of children: Not on file  . Years of education: Not on file  . Highest education level: Not on file  Occupational History  . Not on file  Social Needs  . Financial resource strain: Not on file  . Food insecurity    Worry: Not on file    Inability: Not on file  . Transportation needs    Medical: Not on file    Non-medical: Not on file  Tobacco Use  . Smoking status: Never Smoker  . Smokeless tobacco: Never Used  Substance and Sexual Activity  . Alcohol use: No  . Drug use: No  . Sexual activity: Not on file   Lifestyle  . Physical activity    Days per week: Not on file    Minutes per session: Not on file  . Stress: Not on file  Relationships  . Social Herbalist on phone: Not on file    Gets together: Not on file    Attends religious service: Not on file    Active member of club or organization: Not on file    Attends meetings of clubs or organizations: Not on file    Relationship status: Not on file  Other Topics Concern  . Not on file  Social History Narrative  . Not on file    Outpatient Medications Prior to Visit  Medication Sig Dispense Refill  . albuterol (PROVENTIL HFA;VENTOLIN HFA) 108 (90 Base) MCG/ACT inhaler TAKE 2 PUFFS BY MOUTH EVERY 6 HOURS AS NEEDED FOR WHEEZE OR SHORTNESS OF BREATH 8.5 Inhaler 2  . aspirin EC 81 MG tablet Take 81 mg by mouth daily.    Marland Kitchen buPROPion (WELLBUTRIN XL) 300 MG 24 hr tablet Take 1 tablet (300 mg total) by mouth daily. 90 tablet 1  .  Calcium Carbonate-Vitamin D (CALCIUM 500/D PO) Take 500 mg by mouth daily.    . cholecalciferol (VITAMIN D) 1000 units tablet Take 4,000 Units by mouth daily.    . ciprofloxacin (CIPRO) 250 MG tablet Take 1 tablet (250 mg total) by mouth 2 (two) times daily. 6 tablet 0  . colestipol (COLESTID) 1 g tablet Take 1 g by mouth 2 (two) times daily.    . Cranberry 125 MG TABS Take 1 tablet by mouth daily.    . fluticasone (FLOVENT HFA) 110 MCG/ACT inhaler Inhale 2 puffs into the lungs 2 (two) times daily. 1 Inhaler 12  . gabapentin (NEURONTIN) 100 MG capsule TAKE 1 CAPSULE BY MOUTH TWICE A DAY 180 capsule 1  . gabapentin (NEURONTIN) 300 MG capsule TAKE 1 CAPSULE BY MOUTH THREE TIMES A DAY 270 capsule 2  . Insulin Pen Needle (BD PEN NEEDLE NANO U/F) 32G X 4 MM MISC USE WITH VICTOZA PEN 100 each 2  . loperamide (ANTI-DIARRHEAL) 2 MG tablet Take 2 mg as needed by mouth for diarrhea or loose stools.    . metoprolol succinate (TOPROL-XL) 100 MG 24 hr tablet TAKE 1 TABLET (100 MG TOTAL) BY MOUTH DAILY. TAKE WITH OR  IMMEDIATELY FOLLOWING A MEAL. 90 tablet 1  . montelukast (SINGULAIR) 10 MG tablet TAKE 1 TABLET BY MOUTH EVERYDAY AT BEDTIME 90 tablet 0  . Multiple Vitamin (MULTIVITAMIN) tablet Take 1 tablet daily by mouth.    . ranitidine (ZANTAC) 150 MG tablet TAKE 2 TABLETS (300 MG TOTAL) BY MOUTH AT BEDTIME 180 tablet 3  . VICTOZA 18 MG/3ML SOPN INJECT 1.2 MG INTO THE SKIN DAILY AT THE SAME TIME 2 pen 0  . Mesalamine 800 MG TBEC TAKE 3 TABLETS (2,400 MG TOTAL) BY MOUTH 2 (TWO) TIMES DAILY. 540 tablet 3  . pantoprazole (PROTONIX) 40 MG tablet Take 1 tablet (40 mg total) by mouth 2 (two) times daily. 180 tablet 3   No facility-administered medications prior to visit.      EXAM:  BP 128/70 (BP Location: Left Arm, Patient Position: Sitting, Cuff Size: Normal)   Pulse 80   Temp 97.6 F (36.4 C) (Oral)   Wt 183 lb 9.6 oz (83.3 kg)   LMP  (LMP Unknown)   SpO2 95%   BMI 37.08 kg/m   Body mass index is 37.08 kg/m.  GENERAL: vitals reviewed and listed above, alert, oriented, appears well hydrated and in no acute distress HEENT: atraumatic, conjunctiva  clear, no obvious abnormalities on inspection of external nose and ear  NECK: no obvious masses on inspection palpation  CV: HRRR, no clubbing cyanosis  MS: moves all extremities mild arthritis  changes   Left thumb with curvilinear  1. cm lesion bu tcleabn edge  and nail intact ( painted )  No bleeding active clean edges and clotted no active bleeding or bruising  No weeping.  Slight retraction inversion  of  Wound edge  Wound not over joint.  PSYCH: pleasant and cooperative, no obvious depression or anxiety tdap was in 2015  ASSESSMENT AND PLAN:  Discussed the following assessment and plan:   ICD-10-CM   1. Thumb laceration, left, initial encounter  S61.012A   2. Aspirin long-term use  Z79.82    steristripted  Today  And instruction on care  And can replace if needed   Look for sings of infection or recurrent problem elevation keep dry for now .   At this time do not think suturing would be more helpful than  Other wound opposition.  She is utd on tdap 2015 and clean wound Suspect asa  Contributed to the prolonged bleed  -Patient advised to return or notify health care team  if symptoms worsen ,persist or new concerns arise.  Patient Instructions  Fu as needed for  reasongs indicated     Standley Brooking. [place] M.D.

## 2018-12-28 NOTE — Progress Notes (Signed)
Tara Garner is a 63 y.o. female is here for follow up.  History of Present Illness:   Tara Garner, RMA, acting as scribe for Dr. Briscoe Deutscher.   HPI:   Patient c/o having cloudy urine x 6-7 months now.  Patient explains that she has UTIs often.  No vaginal discharge.  No dysuria, hematuria, change in urination otherwise.  Diabetes.  Patient checks her blood sugars fairly regularly and states that they have been in normal ranges.  She is tolerating her current medications without side effects.  She denies any chest pain, shortness of breath, edema.  She has had about 4 to 8 pounds weight gain since her last visit but this has been during COVID restrictions and isolation.  Patient does ask about takeover of the Victoza prescription today to minimize physician visits.  She has been stable for quite some time.  Patient complains of decreased hearing and tinnitus.  This has been gradually worsening for the past few years.  She is now interested in pursuing a further work-up.  Patient had a difficult time getting into her previous GI doctor.  She has changed to Dr.Parag Garner.  She is now taking Colestid and feels much improved.  Health Maintenance Due  Topic Date Due  . FOOT EXAM  12/12/2017  . HEMOGLOBIN A1C  11/11/2018  . INFLUENZA VACCINE  12/27/2018  . URINE MICROALBUMIN  01/07/2019   Depression screen PHQ 2/9 12/29/2018  Decreased Interest 0  Down, Depressed, Hopeless 0  PHQ - 2 Score 0  Altered sleeping 0  Tired, decreased energy 0  Change in appetite 0  Feeling bad or failure about yourself  0  Trouble concentrating 0  Moving slowly or fidgety/restless 0  Suicidal thoughts 0  PHQ-9 Score 0  Difficult doing work/chores Not difficult at all    PMHx, SurgHx, SocialHx, FamHx, Medications, and Allergies were reviewed in the Visit Navigator and updated as appropriate.   Patient Active Problem List   Diagnosis Date Noted  . Acquired claw toe, left 09/16/2017  .  Diabetic polyneuropathy associated with type 2 diabetes mellitus ([place]) 09/16/2017  . Posterior tibial tendinitis, right leg 09/16/2017  . Anemia in stage 3 chronic kidney disease ([place]) 12/15/2016  . Localized osteoarthritis of right shoulder 10/31/2016  . Diabetic peripheral neuropathy associated with type 2 diabetes mellitus ([place]) 10/23/2016  . Chronic right shoulder pain 09/16/2016  . Morbid obesity ([place]) 09/16/2016  . Seasonal allergic rhinitis due to pollen 09/16/2016  . Gastroesophageal reflux disease without esophagitis 09/16/2016  . Depression, recurrent ([place]) 09/16/2016  . Hypertension associated with diabetes ([place]) 09/16/2016  . Moderate persistent asthma without complication 48/18/5631  . Type 2 diabetes mellitus with stage 3 chronic kidney disease ([place]) 09/16/2016  . Ulcerative colitis with complication ([place]) 49/70/2637   Social History   Tobacco Use  . Smoking status: Never Smoker  . Smokeless tobacco: Never Used  Substance Use Topics  . Alcohol use: No  . Drug use: No   Current Medications and Allergies   .  albuterol (PROVENTIL HFA;VENTOLIN HFA) 108 (90 Base) MCG/ACT inhaler, TAKE 2 PUFFS BY MOUTH EVERY 6 HOURS AS NEEDED FOR WHEEZE OR SHORTNESS OF BREATH, Disp: 8.5 Inhaler, Rfl: 2 .  aspirin EC 81 MG tablet, Take 81 mg by mouth daily., Disp: , Rfl:  .  buPROPion (WELLBUTRIN XL) 300 MG 24 hr tablet, Take 1 tablet (300 mg total) by mouth daily., Disp: 90 tablet, Rfl: 1 .  Calcium Carbonate-Vitamin D (CALCIUM 500/D PO),  Take 500 mg by mouth daily., Disp: , Rfl:  .  cholecalciferol (VITAMIN D) 1000 units tablet, Take 4,000 Units by mouth daily., Disp: , Rfl:  .  colestipol (COLESTID) 1 g tablet, Take 1 g by mouth 2 (two) times daily., Disp: , Rfl:  .  Cranberry 125 MG TABS, Take 1 tablet by mouth daily., Disp: , Rfl:  .  fluticasone (FLOVENT HFA) 110 MCG/ACT inhaler, Inhale 2 puffs into the lungs 2 (two) times daily., Disp: 1 Inhaler, Rfl: 12 .  gabapentin (NEURONTIN) 100  MG capsule, TAKE 1 CAPSULE BY MOUTH TWICE A DAY, Disp: 180 capsule, Rfl: 1 .  gabapentin (NEURONTIN) 300 MG capsule, TAKE 1 CAPSULE BY MOUTH THREE TIMES A DAY, Disp: 270 capsule, Rfl: 2 .  Insulin Pen Needle (BD PEN NEEDLE NANO U/F) 32G X 4 MM MISC, USE WITH VICTOZA PEN, Disp: 100 each, Rfl: 2 .  Mesalamine 800 MG TBEC, TAKE 3 TABLETS (2,400 MG TOTAL) BY MOUTH 2 (TWO) TIMES DAILY., Disp: 540 tablet, Rfl: 3 .  metoprolol succinate (TOPROL-XL) 100 MG 24 hr tablet, TAKE 1 TABLET (100 MG TOTAL) BY MOUTH DAILY. TAKE WITH OR IMMEDIATELY FOLLOWING A MEAL., Disp: 90 tablet, Rfl: 1 .  montelukast (SINGULAIR) 10 MG tablet, TAKE 1 TABLET BY MOUTH EVERYDAY AT BEDTIME, Disp: 90 tablet, Rfl: 0 .  Multiple Vitamin (MULTIVITAMIN) tablet, Take 1 tablet daily by mouth., Disp: , Rfl:  .  pantoprazole (PROTONIX) 40 MG tablet, Take 1 tablet (40 mg total) by mouth 2 (two) times daily., Disp: 180 tablet, Rfl: 3 .  ranitidine (ZANTAC) 150 MG tablet, TAKE 2 TABLETS (300 MG TOTAL) BY MOUTH AT BEDTIME, Disp: 180 tablet, Rfl: 3 .  VICTOZA 18 MG/3ML SOPN, INJECT 1.2 MG INTO THE SKIN DAILY AT THE SAME TIME, Disp: 2 pen, Rfl: 0   Allergies  Allergen Reactions  . Augmentin [Amoxicillin-Pot Clavulanate] Other (See Comments)    Skin sensitivity, very shaky  . Avelox [Moxifloxacin]     Cannot recall  . Benicar [Olmesartan]     Cannot recall  . Ceftin [Cefuroxime]     Cannot recall  . Codeine     Hallucinations  . Imuran [Azathioprine]     Instigates cough  . Indocin [Indomethacin]     Cannot recall reaction  . Methotrexate Derivatives     Instigates cough   Review of Systems   Pertinent items are noted in the HPI. Otherwise, a complete ROS is negative.  Vitals   Vitals:   12/29/18 1040  BP: 130/80  Pulse: 78  Temp: 98.1 F (36.7 C)  TempSrc: Temporal  SpO2: 97%  Weight: 184 lb 3.2 oz (83.6 kg)  Height: 4' 11"  (1.499 m)     Body mass index is 37.2 kg/m.  Physical Exam   Physical Exam Vitals signs  and nursing note reviewed.  HENT:     Head: Normocephalic and atraumatic.  Eyes:     Pupils: Pupils are equal, round, and reactive to light.  Neck:     Musculoskeletal: Normal range of motion and neck supple.  Cardiovascular:     Rate and Rhythm: Normal rate and regular rhythm.     Heart sounds: Normal heart sounds.  Pulmonary:     Effort: Pulmonary effort is normal.  Abdominal:     Palpations: Abdomen is soft.  Skin:    General: Skin is warm.  Psychiatric:        Behavior: Behavior normal.      Diabetic Foot Exam - Simple  Simple Foot Form Diabetic Foot exam was performed with the following findings: Yes 12/29/2018 11:51 AM  Visual Inspection No deformities, no ulcerations, no other skin breakdown bilaterally: Yes Sensation Testing Intact to touch and monofilament testing bilaterally: Yes Pulse Check Posterior Tibialis and Dorsalis pulse intact bilaterally: Yes Comments     Assessment and Plan   Jaala was seen today for follow-up.  Diagnoses and all orders for this visit:  Hypertension associated with diabetes ([place]) -     Comprehensive metabolic panel  Type 2 diabetes mellitus with stage 3 chronic kidney disease, without long-term current use of insulin ([facility]) -     Hemoglobin A1c -     Microalbumin / creatinine urine ratio; Future -     Cancel: Microalbumin / creatinine urine ratio  Diabetic peripheral neuropathy associated with type 2 diabetes mellitus ([facility])  Anemia in stage 3 chronic kidney disease ([facility]) -     CBC with Differential/Platelet  Morbid obesity ([facility])  Cloudy urine -     Urinalysis, Routine w reflex microscopic -     Urine Culture  Seasonal allergic rhinitis due to pollen -     fluticasone (FLOVENT HFA) 110 MCG/ACT inhaler; Inhale 2 puffs into the lungs 2 (two) times daily. -     montelukast (SINGULAIR) 10 MG tablet; TAKE 1 TABLET BY MOUTH EVERYDAY AT BEDTIME  Ulcerative colitis with complication, unspecified location  ([place])  Hyperlipidemia associated with type 2 diabetes mellitus ([facility]) -     Lipid panel  Decreased hearing of both ears -     Ambulatory referral to Audiology  Tinnitus of both ears -     Ambulatory referral to Audiology   . Orders and follow up as documented in [place], reviewed diet, exercise and weight control, cardiovascular risk and specific lipid/LDL goals reviewed, reviewed medications and side effects in detail.  . Reviewed expectations re: course of current medical issues. . Outlined signs and symptoms indicating need for more acute intervention. . Patient verbalized understanding and all questions were answered. . Patient received an After Visit Summary.  CMA served as Education administrator during this visit. History, Physical, and Plan performed by medical provider. The above documentation has been reviewed and is accurate and complete. Briscoe Deutscher, D.O.  Briscoe Deutscher, DO [place] 12/29/2018

## 2018-12-29 ENCOUNTER — Ambulatory Visit (INDEPENDENT_AMBULATORY_CARE_PROVIDER_SITE_OTHER): Payer: BC Managed Care – PPO | Admitting: Family Medicine

## 2018-12-29 ENCOUNTER — Other Ambulatory Visit: Payer: BC Managed Care – PPO

## 2018-12-29 ENCOUNTER — Other Ambulatory Visit: Payer: Self-pay

## 2018-12-29 ENCOUNTER — Encounter: Payer: Self-pay | Admitting: Family Medicine

## 2018-12-29 VITALS — BP 130/80 | HR 78 | Temp 98.1°F | Ht 59.0 in | Wt 184.2 lb

## 2018-12-29 DIAGNOSIS — D631 Anemia in chronic kidney disease: Secondary | ICD-10-CM

## 2018-12-29 DIAGNOSIS — N183 Type 2 diabetes mellitus with diabetic chronic kidney disease: Secondary | ICD-10-CM

## 2018-12-29 DIAGNOSIS — I10 Essential (primary) hypertension: Secondary | ICD-10-CM

## 2018-12-29 DIAGNOSIS — K51919 Ulcerative colitis, unspecified with unspecified complications: Secondary | ICD-10-CM

## 2018-12-29 DIAGNOSIS — J301 Allergic rhinitis due to pollen: Secondary | ICD-10-CM

## 2018-12-29 DIAGNOSIS — I152 Hypertension secondary to endocrine disorders: Secondary | ICD-10-CM

## 2018-12-29 DIAGNOSIS — E1169 Type 2 diabetes mellitus with other specified complication: Secondary | ICD-10-CM | POA: Diagnosis not present

## 2018-12-29 DIAGNOSIS — E1122 Type 2 diabetes mellitus with diabetic chronic kidney disease: Secondary | ICD-10-CM | POA: Diagnosis not present

## 2018-12-29 DIAGNOSIS — R8290 Unspecified abnormal findings in urine: Secondary | ICD-10-CM

## 2018-12-29 DIAGNOSIS — E1159 Type 2 diabetes mellitus with other circulatory complications: Secondary | ICD-10-CM

## 2018-12-29 DIAGNOSIS — Q649 Congenital malformation of urinary system, unspecified: Secondary | ICD-10-CM | POA: Diagnosis not present

## 2018-12-29 DIAGNOSIS — E1142 Type 2 diabetes mellitus with diabetic polyneuropathy: Secondary | ICD-10-CM | POA: Diagnosis not present

## 2018-12-29 DIAGNOSIS — H9313 Tinnitus, bilateral: Secondary | ICD-10-CM

## 2018-12-29 DIAGNOSIS — H9193 Unspecified hearing loss, bilateral: Secondary | ICD-10-CM

## 2018-12-29 DIAGNOSIS — E785 Hyperlipidemia, unspecified: Secondary | ICD-10-CM | POA: Diagnosis not present

## 2018-12-29 LAB — URINALYSIS, ROUTINE W REFLEX MICROSCOPIC
Bilirubin Urine: NEGATIVE
Hgb urine dipstick: NEGATIVE
Ketones, ur: NEGATIVE
Nitrite: POSITIVE — AB
Specific Gravity, Urine: 1.02 (ref 1.000–1.030)
Total Protein, Urine: NEGATIVE
Urine Glucose: NEGATIVE
Urobilinogen, UA: 0.2 (ref 0.0–1.0)
pH: 6 (ref 5.0–8.0)

## 2018-12-29 LAB — MICROALBUMIN / CREATININE URINE RATIO
Creatinine,U: 103.4 mg/dL
Microalb Creat Ratio: 2.1 mg/g (ref 0.0–30.0)
Microalb, Ur: 2.2 mg/dL — ABNORMAL HIGH (ref 0.0–1.9)

## 2018-12-29 LAB — CBC WITH DIFFERENTIAL/PLATELET
Basophils Absolute: 0.1 10*3/uL (ref 0.0–0.1)
Basophils Relative: 1.3 % (ref 0.0–3.0)
Eosinophils Absolute: 0.4 10*3/uL (ref 0.0–0.7)
Eosinophils Relative: 4.8 % (ref 0.0–5.0)
HCT: 41.6 % (ref 36.0–46.0)
Hemoglobin: 13.6 g/dL (ref 12.0–15.0)
Lymphocytes Relative: 33.2 % (ref 12.0–46.0)
Lymphs Abs: 2.6 10*3/uL (ref 0.7–4.0)
MCHC: 32.6 g/dL (ref 30.0–36.0)
MCV: 92.6 fl (ref 78.0–100.0)
Monocytes Absolute: 0.5 10*3/uL (ref 0.1–1.0)
Monocytes Relative: 6.3 % (ref 3.0–12.0)
Neutro Abs: 4.3 10*3/uL (ref 1.4–7.7)
Neutrophils Relative %: 54.4 % (ref 43.0–77.0)
Platelets: 285 10*3/uL (ref 150.0–400.0)
RBC: 4.5 Mil/uL (ref 3.87–5.11)
RDW: 13.8 % (ref 11.5–15.5)
WBC: 8 10*3/uL (ref 4.0–10.5)

## 2018-12-29 LAB — COMPREHENSIVE METABOLIC PANEL
ALT: 22 U/L (ref 0–35)
AST: 27 U/L (ref 0–37)
Albumin: 4.3 g/dL (ref 3.5–5.2)
Alkaline Phosphatase: 68 U/L (ref 39–117)
BUN: 23 mg/dL (ref 6–23)
CO2: 27 mEq/L (ref 19–32)
Calcium: 9.4 mg/dL (ref 8.4–10.5)
Chloride: 104 mEq/L (ref 96–112)
Creatinine, Ser: 1.15 mg/dL (ref 0.40–1.20)
GFR: 47.47 mL/min — ABNORMAL LOW (ref >60.00)
Glucose, Bld: 96 mg/dL (ref 70–99)
Potassium: 4.3 mEq/L (ref 3.5–5.1)
Sodium: 141 mEq/L (ref 135–145)
Total Bilirubin: 0.4 mg/dL (ref 0.2–1.2)
Total Protein: 7 g/dL (ref 6.0–8.3)

## 2018-12-29 LAB — LIPID PANEL
Cholesterol: 145 mg/dL (ref 0–200)
HDL: 51.1 mg/dL (ref >39.00)
LDL Cholesterol: 55 mg/dL (ref 0–99)
NonHDL: 94.36
Total CHOL/HDL Ratio: 3
Triglycerides: 196 mg/dL — ABNORMAL HIGH (ref 0.0–149.0)
VLDL: 39.2 mg/dL (ref 0.0–40.0)

## 2018-12-29 LAB — HEMOGLOBIN A1C: Hgb A1c MFr Bld: 6 % (ref 4.6–6.5)

## 2018-12-29 MED ORDER — MONTELUKAST SODIUM 10 MG PO TABS: ORAL_TABLET | ORAL | 0 refills | Status: DC

## 2018-12-29 MED ORDER — FLOVENT HFA 110 MCG/ACT IN AERO: 2.0000 | INHALATION_SPRAY | Freq: Two times a day (BID) | RESPIRATORY_TRACT | 12 refills | Status: DC

## 2018-12-29 NOTE — Patient Instructions (Signed)
Health Maintenance Due  Topic Date Due  . FOOT EXAM  12/12/2017  . HEMOGLOBIN A1C  11/11/2018  . INFLUENZA VACCINE  12/27/2018  . URINE MICROALBUMIN  01/07/2019    Depression screen PHQ 2/9 12/29/2018  Decreased Interest 0  Down, Depressed, Hopeless 0  PHQ - 2 Score 0  Altered sleeping 0  Tired, decreased energy 0  Change in appetite 0  Feeling bad or failure about yourself  0  Trouble concentrating 0  Moving slowly or fidgety/restless 0  Suicidal thoughts 0  PHQ-9 Score 0  Difficult doing work/chores Not difficult at all

## 2019-01-01 LAB — URINE CULTURE
MICRO NUMBER:: 730246
SPECIMEN QUALITY:: ADEQUATE

## 2019-01-04 MED ORDER — NITROFURANTOIN MONOHYD MACRO 100 MG PO CAPS: 100.0000 mg | ORAL_CAPSULE | Freq: Two times a day (BID) | ORAL | 0 refills | Status: DC

## 2019-01-04 NOTE — Addendum Note (Signed)
Addended by: Briscoe Deutscher R on: 01/04/2019 03:17 PM   Modules accepted: Orders

## 2019-01-06 ENCOUNTER — Encounter (HOSPITAL_COMMUNITY): Payer: Self-pay

## 2019-01-06 ENCOUNTER — Ambulatory Visit: Payer: Self-pay | Admitting: Family Medicine

## 2019-01-06 ENCOUNTER — Emergency Department (HOSPITAL_COMMUNITY)
Admission: EM | Admit: 2019-01-06 | Discharge: 2019-01-06 | Disposition: A | Discharge status: Home / Self Care | Payer: BC Managed Care – PPO | Attending: Emergency Medicine | Admitting: Emergency Medicine

## 2019-01-06 ENCOUNTER — Other Ambulatory Visit: Payer: Self-pay

## 2019-01-06 DIAGNOSIS — R002 Palpitations: Secondary | ICD-10-CM | POA: Diagnosis not present

## 2019-01-06 DIAGNOSIS — Z79899 Other long term (current) drug therapy: Secondary | ICD-10-CM | POA: Insufficient documentation

## 2019-01-06 DIAGNOSIS — Z7982 Long term (current) use of aspirin: Secondary | ICD-10-CM | POA: Diagnosis not present

## 2019-01-06 DIAGNOSIS — I10 Essential (primary) hypertension: Secondary | ICD-10-CM | POA: Insufficient documentation

## 2019-01-06 DIAGNOSIS — N390 Urinary tract infection, site not specified: Secondary | ICD-10-CM | POA: Diagnosis not present

## 2019-01-06 DIAGNOSIS — Z86718 Personal history of other venous thrombosis and embolism: Secondary | ICD-10-CM | POA: Diagnosis not present

## 2019-01-06 DIAGNOSIS — E119 Type 2 diabetes mellitus without complications: Secondary | ICD-10-CM | POA: Diagnosis not present

## 2019-01-06 DIAGNOSIS — Z8709 Personal history of other diseases of the respiratory system: Secondary | ICD-10-CM | POA: Insufficient documentation

## 2019-01-06 DIAGNOSIS — R42 Dizziness and giddiness: Secondary | ICD-10-CM | POA: Diagnosis not present

## 2019-01-06 HISTORY — DX: Unspecified osteoarthritis, unspecified site: M19.90

## 2019-01-06 LAB — BASIC METABOLIC PANEL
Anion gap: 9 (ref 5–15)
BUN: 20 mg/dL (ref 8–23)
CO2: 22 mmol/L (ref 22–32)
Calcium: 8.5 mg/dL — ABNORMAL LOW (ref 8.9–10.3)
Chloride: 109 mmol/L (ref 98–111)
Creatinine, Ser: 1.24 mg/dL — ABNORMAL HIGH (ref 0.44–1.00)
GFR calc Af Amer: 53 mL/min — ABNORMAL LOW (ref >60)
GFR calc non Af Amer: 46 mL/min — ABNORMAL LOW (ref >60)
Glucose, Bld: 158 mg/dL — ABNORMAL HIGH (ref 70–99)
Potassium: 3.9 mmol/L (ref 3.5–5.1)
Sodium: 140 mmol/L (ref 135–145)

## 2019-01-06 LAB — CBC WITH DIFFERENTIAL/PLATELET
Abs Immature Granulocytes: 0.01 10*3/uL (ref 0.00–0.07)
Basophils Absolute: 0.1 10*3/uL (ref 0.0–0.1)
Basophils Relative: 1 %
Eosinophils Absolute: 0.3 10*3/uL (ref 0.0–0.5)
Eosinophils Relative: 4 %
HCT: 40.3 % (ref 36.0–46.0)
Hemoglobin: 12.8 g/dL (ref 12.0–15.0)
Immature Granulocytes: 0 %
Lymphocytes Relative: 32 %
Lymphs Abs: 2.3 10*3/uL (ref 0.7–4.0)
MCH: 29.8 pg (ref 26.0–34.0)
MCHC: 31.8 g/dL (ref 30.0–36.0)
MCV: 93.9 fL (ref 80.0–100.0)
Monocytes Absolute: 0.4 10*3/uL (ref 0.1–1.0)
Monocytes Relative: 6 %
Neutro Abs: 3.9 10*3/uL (ref 1.7–7.7)
Neutrophils Relative %: 57 %
Platelets: 255 10*3/uL (ref 150–400)
RBC: 4.29 MIL/uL (ref 3.87–5.11)
RDW: 13.2 % (ref 11.5–15.5)
WBC: 7 10*3/uL (ref 4.0–10.5)
nRBC: 0 % (ref 0.0–0.2)

## 2019-01-06 LAB — URINALYSIS, ROUTINE W REFLEX MICROSCOPIC
Glucose, UA: 50 mg/dL — AB
Ketones, ur: NEGATIVE mg/dL
Nitrite: NEGATIVE
Protein, ur: NEGATIVE mg/dL
Specific Gravity, Urine: 1.015 (ref 1.005–1.030)
pH: 5 (ref 5.0–8.0)

## 2019-01-06 MED ORDER — CEPHALEXIN 250 MG PO CAPS
500.0000 mg | ORAL_CAPSULE | Freq: Once | ORAL | Status: AC
  Administered 2019-01-06: 12:00:00 500 mg via ORAL
  Filled 2019-01-06: qty 2

## 2019-01-06 MED ORDER — CEPHALEXIN 500 MG PO CAPS: 500.0000 mg | ORAL_CAPSULE | Freq: Two times a day (BID) | ORAL | 0 refills | Status: AC

## 2019-01-06 MED ORDER — SODIUM CHLORIDE 0.9 % IV BOLUS
1000.0000 mL | Freq: Once | INTRAVENOUS | Status: AC
  Administered 2019-01-06: 1000 mL via INTRAVENOUS

## 2019-01-06 NOTE — ED Triage Notes (Signed)
Pt started on nitrofurantoin for uti yesterday; states she began to feel "out of it", anxiety, heart racing a couple of hours after taking; pt woke around 0200 this am, heart racing, diaphoresis; pt also endorses dizziness with standing; c/o L hand pain when she goes to grab something, hx arthritis, but states "its a liitle different than normal", pt also endorses increased joint stiffness

## 2019-01-06 NOTE — Telephone Encounter (Signed)
Pt called in co "my heart is beating fast, I feel out of it, I had cold sweats last night and I'm real dizzy when I first get up from bed or from a chair".    I started Macrobid yesterday for a UTI.  I've not felt well since.  The protocol is ED/PCP triage.    I called into Dr. Alcario Drought office and spoke with [facility].  They do not have any openings today and with her symptoms she needs to go to the ED.  I let the pt know she needed to go to the ED because her symptoms may or may not be due to the [place].   She did not want to go however she agreed to go.   She is going to have her husband take her to [facility] ED after she takes a shower.    I instructed her to please be careful and not fall in the shower.  She verbalized understanding Reason for Disposition . Patient sounds very sick or weak to the triager  Answer Assessment - Initial Assessment Questions 1. DESCRIPTION: "Please describe your heart rate or heart beat that you are having" (e.g., fast/slow, regular/irregular, skipped or extra beats, "palpitations")     I started Macrobid yesterday.    A few hours yesterday I had stomach cramps and felt sick.   I felt so "out of it" yesterday.    I didn't feel like doing anything all day.     I took the next pill.    During the night I had cold sweats, a rapid heart rate and "out of it".   I'm still in the bed now.     No chest pain or shortness of breath.  No underlying heart problems.   Denies being dizzy or lightheaded.    "I just don't feel like I'm with it".     2. ONSET: "When did it start?" (Minutes, hours or days)      Yesterday after starting the medication for UTI.   I can't take Penicillin in the past.    3. DURATION: "How long does it last" (e.g., seconds, minutes, hours)     Since I took the first dose of the medication yesterday. My heart rate is still feeling fast.     "I don't fill a peaceful feeling this morning".    4. PATTERN "Does it come and go, or has it been constant  since it started?"  "Does it get worse with exertion?"   "Are you feeling it now?"     Constant since yesterday. 5. TAP: "Using your hand, can you tap out what you are feeling on a chair or table in front of you, so that I can hear?" (Note: not all patients can do this)       Unable. 6. HEART RATE: "Can you tell me your heart rate?" "How many beats in 15 seconds?"  (Note: not all patients can do this)       I get real dizzy when I get up from a chair or out of bed.   It just lasts for a minute then it goes away.   This also started yesterday with the other symptoms. 7. RECURRENT SYMPTOM: "Have you ever had this before?" If so, ask: "When was the last time?" and "What happened that time?"      See above regarding Penicillin. 8. CAUSE: "What do you think is causing the palpitations?"     I think it's from the  medication for the UTI 9. CARDIAC HISTORY: "Do you have any history of heart disease?" (e.g., heart attack, angina, bypass surgery, angioplasty, arrhythmia)      No 10. OTHER SYMPTOMS: "Do you have any other symptoms?" (e.g., dizziness, chest pain, sweating, difficulty breathing)       Cold sweats last night, dizziness, no chest pain or shortness of breath. 11. PREGNANCY: "Is there any chance you are pregnant?" "When was your last menstrual period?"       N/A due to age  Protocols used: [place]

## 2019-01-06 NOTE — ED Notes (Signed)
Patient verbalizes understanding of discharge instructions. Opportunity for questioning and answering were provided.  patient discharged from ED.  Husband here to pick patient up

## 2019-01-06 NOTE — ED Provider Notes (Signed)
[facility] EMERGENCY DEPARTMENT Provider Note   CSN: [id number] Arrival date & time: 01/06/19  1103    History   Chief Complaint Chief Complaint  Patient presents with  . Dizziness    HPI Tara Garner is a 63 y.o. female.     The history is provided by the patient.  Dizziness Quality:  Lightheadedness Severity:  Mild Onset quality:  Gradual Progression:  Resolved Chronicity:  New Context comment:  Current UTI, possible dehyrdation, med side effect.  Relieved by:  Nothing Worsened by:  Nothing Associated symptoms: palpitations   Associated symptoms: no blood in stool, no chest pain, no diarrhea, no headaches, no hearing loss, no nausea, no shortness of breath, no syncope, no tinnitus, no vision changes, no vomiting and no weakness   Risk factors: new medications     Past Medical History:  Diagnosis Date  . Acid reflux   . Arthritis   . Asthma   . Cough due to ACE inhibitor   . Depression, recurrent ([place]) 09/16/2016  . Diabetes mellitus without complication ([place])   . DVT (deep venous thrombosis) ([place])   . Hypertension   . Morbid obesity ([place]) 09/16/2016  . Seasonal allergic rhinitis due to pollen 09/16/2016  . Ulcerative colitis [facility])     Patient Active Problem List   Diagnosis Date Noted  . Acquired claw toe, left 09/16/2017  . Diabetic polyneuropathy associated with type 2 diabetes mellitus ([place]) 09/16/2017  . Posterior tibial tendinitis, right leg 09/16/2017  . Anemia in stage 3 chronic kidney disease ([place]) 12/15/2016  . Localized osteoarthritis of right shoulder 10/31/2016  . Diabetic peripheral neuropathy associated with type 2 diabetes mellitus ([place]) 10/23/2016  . Chronic right shoulder pain 09/16/2016  . Morbid obesity ([place]) 09/16/2016  . Seasonal allergic rhinitis due to pollen 09/16/2016  . Gastroesophageal reflux disease without esophagitis 09/16/2016  . Depression, recurrent ([place]) 09/16/2016  . Hypertension associated with  diabetes ([facility]) 09/16/2016  . Moderate persistent asthma without complication 59/56/3875  . Type 2 diabetes mellitus with stage 3 chronic kidney disease ([place]) 09/16/2016  . Ulcerative colitis with complication ([place]) 64/33/2951    Past Surgical History:  Procedure Laterality Date  . ABDOMINAL HYSTERECTOMY    . BREAST BIOPSY Left    benign  . CATARACT EXTRACTION    . CESAREAN SECTION    . COLONOSCOPY  2012,2015,2016   Dr.Andreson and in [place]   . KNEE SURGERY Left   . SHOULDER SURGERY Right      OB History   No obstetric history on file.      Home Medications    Prior to Admission medications   Medication Sig Start Date End Date Taking? Authorizing Provider  albuterol (PROVENTIL HFA;VENTOLIN HFA) 108 (90 Base) MCG/ACT inhaler TAKE 2 PUFFS BY MOUTH EVERY 6 HOURS AS NEEDED FOR WHEEZE OR SHORTNESS OF BREATH Patient taking differently: Inhale 1 puff into the lungs every 6 (six) hours as needed for shortness of breath.  01/17/18  Yes Briscoe Deutscher, DO  aspirin EC 81 MG tablet Take 81 mg by mouth daily.   Yes [provider]  buPROPion (WELLBUTRIN XL) 300 MG 24 hr tablet Take 1 tablet (300 mg total) by mouth daily. 12/08/18  Yes Briscoe Deutscher, DO  Calcium Carbonate-Vitamin D (CALCIUM 500/D PO) Take 500 mg by mouth daily.   Yes [provider]  cholecalciferol (VITAMIN D) 1000 units tablet Take 4,000 Units by mouth daily.   Yes [provider]  colestipol (COLESTID) 1  g tablet Take 1 g by mouth 2 (two) times daily.   Yes [provider]  Cranberry 125 MG TABS Take 1 tablet by mouth daily.   Yes [provider]  fluticasone (FLOVENT HFA) 110 MCG/ACT inhaler Inhale 2 puffs into the lungs 2 (two) times daily. 12/29/18  Yes Briscoe Deutscher, DO  gabapentin (NEURONTIN) 100 MG capsule TAKE 1 CAPSULE BY MOUTH TWICE A DAY Patient taking differently: Take 100 mg by mouth every morning.  12/08/18  Yes Briscoe Deutscher, DO  gabapentin (NEURONTIN) 300  MG capsule TAKE 1 CAPSULE BY MOUTH THREE TIMES A DAY Patient taking differently: Take 600 mg by mouth at bedtime.  08/11/18  Yes Briscoe Deutscher, DO  Insulin Pen Needle (BD PEN NEEDLE NANO U/F) 32G X 4 MM MISC USE WITH VICTOZA PEN 04/28/18  Yes Elayne Snare, MD  loperamide (ANTI-DIARRHEAL) 2 MG tablet Take 2 mg by mouth every morning.    Yes [provider]  Mesalamine 800 MG TBEC Take 2,400 mg by mouth 2 (two) times daily.   Yes [provider]  metoprolol succinate (TOPROL-XL) 100 MG 24 hr tablet TAKE 1 TABLET (100 MG TOTAL) BY MOUTH DAILY. TAKE WITH OR IMMEDIATELY FOLLOWING A MEAL. 06/11/18  Yes Briscoe Deutscher, DO  montelukast (SINGULAIR) 10 MG tablet TAKE 1 TABLET BY MOUTH EVERYDAY AT BEDTIME 12/29/18  Yes Briscoe Deutscher, DO  Multiple Vitamin (MULTIVITAMIN) tablet Take 1 tablet daily by mouth.   Yes [provider]  nitrofurantoin, macrocrystal-monohydrate, (MACROBID) 100 MG capsule Take 1 capsule (100 mg total) by mouth 2 (two) times daily. 01/04/19  Yes Briscoe Deutscher, DO  pantoprazole (PROTONIX) 40 MG tablet Take 40 mg by mouth daily.   Yes [provider]  VICTOZA 18 MG/3ML SOPN INJECT 1.2 MG INTO THE SKIN DAILY AT THE SAME TIME Patient taking differently: Inject 1.2 mg into the skin daily.  12/06/18  Yes Elayne Snare, MD  cephALEXin (KEFLEX) 500 MG capsule Take 1 capsule (500 mg total) by mouth 2 (two) times daily for 7 days. 01/06/19 01/13/19  Lennice Sites, DO    Family History Family History  Problem Relation Age of Onset  . Diabetes Mother   . Dementia Mother   . Stroke Mother   . Coronary artery disease Mother   . Cancer Brother   . Diverticulitis Brother   . Colon cancer Brother 51  . COPD Father   . Breast cancer Cousin   . Rectal cancer Neg Hx   . Stomach cancer Neg Hx     Social History Social History   Tobacco Use  . Smoking status: Never Smoker  . Smokeless tobacco: Never Used  Substance Use Topics  . Alcohol use: No  . Drug use: No      Allergies   Augmentin [amoxicillin-pot clavulanate], Avelox [moxifloxacin], Benicar [olmesartan], Ceftin [cefuroxime], Codeine, Imuran [azathioprine], Indocin [indomethacin], and Methotrexate derivatives   Review of Systems Review of Systems  Constitutional: Negative for chills and fever.  HENT: Negative for ear pain, hearing loss, sore throat and tinnitus.   Eyes: Negative for pain and visual disturbance.  Respiratory: Negative for cough and shortness of breath.   Cardiovascular: Positive for palpitations. Negative for chest pain and syncope.  Gastrointestinal: Negative for abdominal pain, blood in stool, diarrhea, nausea and vomiting.  Genitourinary: Positive for urgency. Negative for dysuria and hematuria.  Musculoskeletal: Negative for arthralgias and back pain.  Skin: Negative for color change and rash.  Neurological: Positive for dizziness. Negative for seizures, syncope, weakness  and headaches.  All other systems reviewed and are negative.    Physical Exam Updated Vital Signs  ED Triage Vitals  Enc Vitals Group     BP 01/06/19 1111 (!) 144/118     Pulse Rate 01/06/19 1111 76     Resp 01/06/19 1111 20     Temp 01/06/19 1111 97.7 F (36.5 C)     Temp Source 01/06/19 1111 Oral     SpO2 01/06/19 1111 97 %     Weight 01/06/19 1125 183 lb (83 kg)     Height 01/06/19 1125 4' 11"  (1.499 m)     Head Circumference --      Peak Flow --      Pain Score 01/06/19 1125 0     Pain Loc --      Pain Edu? --      Excl. in [place]? --     Physical Exam Vitals signs and nursing note reviewed.  Constitutional:      General: She is not in acute distress.    Appearance: She is well-developed. She is not ill-appearing.  HENT:     Head: Normocephalic and atraumatic.     Nose: Nose normal.     Mouth/Throat:     Mouth: Mucous membranes are moist.  Eyes:     Extraocular Movements: Extraocular movements intact.     Conjunctiva/sclera: Conjunctivae normal.     Pupils: Pupils are  equal, round, and reactive to light.  Neck:     Musculoskeletal: Normal range of motion and neck supple.  Cardiovascular:     Rate and Rhythm: Normal rate and regular rhythm.     Heart sounds: No murmur.  Pulmonary:     Effort: Pulmonary effort is normal. No respiratory distress.     Breath sounds: Normal breath sounds.  Abdominal:     General: Abdomen is flat. There is no distension.     Palpations: Abdomen is soft.     Tenderness: There is no abdominal tenderness.  Musculoskeletal: Normal range of motion.  Skin:    General: Skin is warm and dry.  Neurological:     General: No focal deficit present.     Mental Status: She is alert and oriented to person, place, and time.     Cranial Nerves: No cranial nerve deficit.     Sensory: No sensory deficit.     Motor: No weakness.     Coordination: Coordination normal.     Gait: Gait normal.     Comments: 5+ out of 5 strength throughout, normal sensation, normal finger-to-nose finger, no drift  Psychiatric:        Mood and Affect: Mood normal.      ED Treatments / Results  Labs (all labs ordered are listed, but only abnormal results are displayed) Labs Reviewed  BASIC METABOLIC PANEL - Abnormal; Notable for the following components:      Result Value   Glucose, Bld 158 (*)    Creatinine, Ser 1.24 (*)    Calcium 8.5 (*)    GFR calc non Af Amer 46 (*)    GFR calc Af Amer 53 (*)    All other components within normal limits  URINALYSIS, ROUTINE W REFLEX MICROSCOPIC - Abnormal; Notable for the following components:   Glucose, UA 50 (*)    Hgb urine dipstick SMALL (*)    Bilirubin Urine MODERATE (*)    Leukocytes,Ua TRACE (*)    Bacteria, UA RARE (*)    All other components  within normal limits  URINE CULTURE  CBC WITH DIFFERENTIAL/PLATELET    EKG EKG Interpretation  Date/Time:  Tuesday January 06 2019 11:10:25 EDT Ventricular Rate:  75 PR Interval:  212 QRS Duration: 96 QT Interval:  388 QTC Calculation: 433 R Axis:    79 Text Interpretation:  Sinus rhythm with 1st degree A-V block Low voltage QRS Borderline ECG Confirmed by Lennice Sites [phone]) on 01/06/2019 11:28:31 AM   Radiology No results found.  Procedures Procedures (including critical care time)  Medications Ordered in ED Medications  sodium chloride 0.9 % bolus 1,000 mL (0 mLs Intravenous Stopped 01/06/19 1228)  cephALEXin (KEFLEX) capsule 500 mg (500 mg Oral Given 01/06/19 1156)     Initial Impression / Assessment and Plan / ED Course  I have reviewed the triage vital signs and the nursing notes.  Pertinent labs & imaging results that were available during my care of the patient were reviewed by me and considered in my medical decision making (see chart for details).     Ahni Bradwell is a 63 year old female with history of diabetes, hypertension, allergies who presents to the ED after episode of lightheadedness.  This has now resolved.  Asymptomatic currently.  Normal vitals.  No fever.  Currently on Macrobid for UTI.  States possible left symptoms are from medication.  She states that she has some palpitations.  She did not pass out.  Denies any abdominal pain.  No flank pain.  Will switch antibiotic over to Keflex.  Urine culture shows that this is sensitive to this.  Has a history of UTIs.  EKG shows sinus rhythm.  No ischemic changes.  Lab work showed no significant anemia, electrolyte abnormality, kidney injury.  Urinalysis improving.  Will send new urine culture.  Patient was given Keflex without any issues in the ED.  She was given IV fluid hydration.  Suspect that patient likely had either medication side effect or some symptoms secondary to dehydration that has now resolved.  Recommend that she continue Keflex and follow-up outpatient with primary care doctor.  No concern for any pulmonary or cardiac process.  Hemodynamically stable and discharged from ED in good condition.  This chart was dictated using voice recognition software.   Despite best efforts to proofread,  errors can occur which can change the documentation meaning.    Final Clinical Impressions(s) / ED Diagnoses   Final diagnoses:  Urinary tract infection without hematuria, site unspecified    ED Discharge Orders         Ordered    cephALEXin (KEFLEX) 500 MG capsule  2 times daily     01/06/19 [place], Quita Skye, DO 01/06/19 1439

## 2019-01-06 NOTE — Telephone Encounter (Signed)
FYI

## 2019-01-07 LAB — URINE CULTURE: Culture: <10000 — AB

## 2019-01-09 ENCOUNTER — Ambulatory Visit
Admission: RE | Admit: 2019-01-09 | Discharge: 2019-01-09 | Disposition: A | Discharge status: Home / Self Care | Payer: BC Managed Care – PPO | Source: Ambulatory Visit | Attending: Family Medicine | Admitting: Family Medicine

## 2019-01-09 ENCOUNTER — Other Ambulatory Visit: Payer: Self-pay

## 2019-01-09 DIAGNOSIS — Z1231 Encounter for screening mammogram for malignant neoplasm of breast: Secondary | ICD-10-CM | POA: Diagnosis not present

## 2019-01-26 DIAGNOSIS — H903 Sensorineural hearing loss, bilateral: Secondary | ICD-10-CM | POA: Diagnosis not present

## 2019-01-29 DIAGNOSIS — H905 Unspecified sensorineural hearing loss: Secondary | ICD-10-CM | POA: Insufficient documentation

## 2019-02-10 ENCOUNTER — Telehealth: Payer: Self-pay | Admitting: Family Medicine

## 2019-02-10 ENCOUNTER — Other Ambulatory Visit: Payer: Self-pay

## 2019-02-10 ENCOUNTER — Ambulatory Visit (INDEPENDENT_AMBULATORY_CARE_PROVIDER_SITE_OTHER): Payer: BC Managed Care – PPO | Admitting: *Deleted

## 2019-02-10 DIAGNOSIS — Z23 Encounter for immunization: Secondary | ICD-10-CM | POA: Diagnosis not present

## 2019-02-10 NOTE — Telephone Encounter (Signed)
See note

## 2019-02-10 NOTE — Telephone Encounter (Signed)
Requested medication (s) are due for refill today: yes  Requested medication (s) are on the active medication list: yes  Last refill: 12/06/2018  Future visit scheduled: yes  Notes to clinic:  Pt needs 90 day supplies with recurring refills for insurance purposes. Stated she discussed with Dr. Juleen China about taking over these rxs   Requested Prescriptions  Pending Prescriptions Disp Refills   VICTOZA 18 MG/3ML SOPN 2 pen 0    Sig: INJECT 1.2 MG INTO THE SKIN DAILY AT [place]     Endocrinology:  Diabetes - GLP-1 Receptor Agonists Passed - 02/10/2019  2:16 PM      Passed - HBA1C is between 0 and 7.9 and within 180 days    Hemoglobin A1C  Date Value Ref Range Status  04/09/2016 5.7  Final   Hgb A1c MFr Bld  Date Value Ref Range Status  12/29/2018 6.0 4.6 - 6.5 % Final    Comment:    Glycemic Control Guidelines for People with Diabetes:Non Diabetic:  <6%Goal of Therapy: <7%Additional Action Suggested:  >8%          Passed - Valid encounter within last 6 months    Recent Outpatient Visits          1 month ago Hypertension associated with diabetes ([place])   [place] Wallace, [place], DO   1 month ago Thumb laceration, left, initial Education administrator at [employer], Standley Brooking, MD   7 months ago Acute cystitis without hematuria   [place] Wolfe, Ebony Hail, MD   8 months ago [place], [place], [place]   10 months ago Labia irritation   [place], [place], [place]              Insulin Pen Needle (BD PEN NEEDLE NANO U/F) 32G X 4 MM MISC 100 each 2    Sig: USE WITH Datil PEN     Endocrinology: Diabetes - Testing Supplies Passed - 02/10/2019  2:16 PM      Passed - Valid encounter within last 12 months    Recent Outpatient Visits          1 month ago Hypertension associated with diabetes ([place])   [place]  Wallace, [place], DO   1 month ago Thumb laceration, left, 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

## 2019-02-10 NOTE — Telephone Encounter (Signed)
Medication Refill - Medication:VICTOZA 18 MG/3ML SOPN/Insulin Pen Needle (BD PEN NEEDLE NANO U/F) 32G X 4 MM MISC/ Pt needs 90 day supplies with recurring refills for insurance purposes. Stated she discussed with Dr. Juleen China about taking over these rxs.  Has the patient contacted their pharmacy? Yes.   (Agent: If no, request that the patient contact the pharmacy for the refill.) (Agent: If yes, when and what did the pharmacy advise?)  Preferred Pharmacy (with phone number or street name):  [id number] [place] [phone](Phone) [phone](Fax)     Agent: Please be advised that RX refills may take up to 3 business days. We ask that you follow-up with your pharmacy.

## 2019-02-12 NOTE — Telephone Encounter (Signed)
Patient is calling to check on the status of this request.  Paient was advised that medication refills can take of to 3 business days.

## 2019-02-12 NOTE — Telephone Encounter (Signed)
Please contact pt regarding refill request

## 2019-02-13 NOTE — Telephone Encounter (Signed)
See where last note she wanted to know if you would take over this med so she did not need to keep going to endo? Ok to give?

## 2019-02-14 NOTE — Telephone Encounter (Signed)
Okay to give.

## 2019-02-16 ENCOUNTER — Other Ambulatory Visit: Payer: Self-pay

## 2019-02-16 MED ORDER — BD PEN NEEDLE NANO U/F 32G X 4 MM MISC: 2 refills | Status: DC

## 2019-02-16 MED ORDER — VICTOZA 18 MG/3ML ~~LOC~~ SOPN: PEN_INJECTOR | SUBCUTANEOUS | 3 refills | Status: DC

## 2019-02-16 NOTE — Telephone Encounter (Signed)
Sent in to pharmacy.  

## 2019-03-09 ENCOUNTER — Other Ambulatory Visit: Payer: Self-pay | Admitting: Family Medicine

## 2019-03-13 DIAGNOSIS — E119 Type 2 diabetes mellitus without complications: Secondary | ICD-10-CM | POA: Diagnosis not present

## 2019-03-13 LAB — HM DIABETES EYE EXAM

## 2019-03-25 ENCOUNTER — Other Ambulatory Visit: Payer: Self-pay | Admitting: Family Medicine

## 2019-03-25 DIAGNOSIS — J301 Allergic rhinitis due to pollen: Secondary | ICD-10-CM

## 2019-04-21 ENCOUNTER — Other Ambulatory Visit: Payer: Self-pay | Admitting: Family Medicine

## 2019-04-21 DIAGNOSIS — J301 Allergic rhinitis due to pollen: Secondary | ICD-10-CM

## 2019-04-29 DIAGNOSIS — K9089 Other intestinal malabsorption: Secondary | ICD-10-CM | POA: Diagnosis not present

## 2019-04-29 DIAGNOSIS — K5190 Ulcerative colitis, unspecified, without complications: Secondary | ICD-10-CM | POA: Diagnosis not present

## 2019-04-29 DIAGNOSIS — K219 Gastro-esophageal reflux disease without esophagitis: Secondary | ICD-10-CM | POA: Diagnosis not present

## 2019-04-30 ENCOUNTER — Encounter: Payer: Self-pay | Admitting: Family Medicine

## 2019-04-30 ENCOUNTER — Ambulatory Visit (INDEPENDENT_AMBULATORY_CARE_PROVIDER_SITE_OTHER): Payer: BC Managed Care – PPO | Admitting: Family Medicine

## 2019-04-30 VITALS — BP 125/68 | HR 71 | Temp 98.8°F | Ht 59.0 in | Wt 183.0 lb

## 2019-04-30 DIAGNOSIS — I10 Essential (primary) hypertension: Secondary | ICD-10-CM | POA: Diagnosis not present

## 2019-04-30 DIAGNOSIS — E1140 Type 2 diabetes mellitus with diabetic neuropathy, unspecified: Secondary | ICD-10-CM | POA: Diagnosis not present

## 2019-04-30 DIAGNOSIS — F339 Major depressive disorder, recurrent, unspecified: Secondary | ICD-10-CM

## 2019-04-30 DIAGNOSIS — E1159 Type 2 diabetes mellitus with other circulatory complications: Secondary | ICD-10-CM

## 2019-04-30 DIAGNOSIS — I152 Hypertension secondary to endocrine disorders: Secondary | ICD-10-CM

## 2019-04-30 MED ORDER — BUPROPION HCL ER (XL) 150 MG PO TB24: 150.0000 mg | ORAL_TABLET | Freq: Every day | ORAL | 1 refills | Status: DC

## 2019-04-30 NOTE — Progress Notes (Signed)
Patient: Tara Garner MRN: [id number] DOB: 12-Dec-1955 PCP: Briscoe Deutscher, DO     I connected with Tara Garner on 04/30/19 at 1:42pm by a video enabled telemedicine application and verified that I am speaking with the correct person using two identifiers.  Location patient: Home Location provider: [facility], Office Persons participating in this virtual visit: Tara Garner and DR. Rogers Blocker   I discussed the limitations of evaluation and management by telemedicine and the availability of in person appointments. The patient expressed understanding and agreed to proceed.   Interactive audio and video telecommunications were attempted between this provider and patient, however failed, due to patient having technical difficulties OR patient did not have access to video capability.  We continued and completed visit with audio only.    Subjective:  Chief Complaint  Patient presents with  . Transitions Of Care  . Hypertension  . Diabetes  . Depression    HPI: The patient is a 63 y.o. female who presents today for transfer of care and follow up of chronic conditions.  Hypertension: Here for follow up of hypertension.  Currently on metoprolol 18m.  Takes medication as prescribed and denies any side effects. Exercise includes walking. Weight has been stable. Denies any chest pain, headaches, shortness of breath, vision changes, swelling in lower extremities.   Diabetes: Patient is here for follow up of type 2 diabetes. First diagnosed 2007.  Currently on the following medications victoza. Takes medications as prescribed. Last A1C was 6.0. Currently exercising and following diabetic diet. Denies any hypoglycemic events. Denies any vision changes, nausea, vomiting, abdominal pain, ulcers/paraesthesia in feet, polyuria, polydipsia or polyphagia. Denies any chest pain, shortness of breath. She is not on any statin drug.    UC: followed by GI and on mesalamine  Depression: currently on  wellbutrin. Isn't sure she is really depressed.   Review of Systems  Constitutional: Negative for chills, fatigue and fever.  HENT: Negative for dental problem, ear pain, hearing loss and trouble swallowing.   Eyes: Negative for visual disturbance.  Respiratory: Negative for cough, chest tightness and shortness of breath.   Cardiovascular: Negative for chest pain, palpitations and leg swelling.  Gastrointestinal: Negative for abdominal pain, blood in stool, constipation, diarrhea, nausea and vomiting.  Endocrine: Negative for cold intolerance, heat intolerance, polydipsia, polyphagia and polyuria.  Genitourinary: Negative for dysuria and hematuria.  Musculoskeletal: Negative for arthralgias.  Skin: Negative for rash.  Neurological: Negative for dizziness and headaches.  Psychiatric/Behavioral: Negative for dysphoric mood and sleep disturbance. The patient is not nervous/anxious.     Allergies Patient is allergic to macrobid [nitrofurantoin macrocrystal]; augmentin [amoxicillin-pot clavulanate]; avelox [moxifloxacin]; benicar [olmesartan]; ceftin [cefuroxime]; codeine; imuran [azathioprine]; indocin [indomethacin]; and methotrexate derivatives.  Past Medical History Patient  has a past medical history of Acid reflux, Arthritis, Asthma, Cough due to ACE inhibitor, Depression, recurrent ([place] (09/16/2016), Diabetes mellitus without complication ([place], DVT (deep venous thrombosis) ([facility], Hypertension, Morbid obesity ([place] (09/16/2016), Seasonal allergic rhinitis due to pollen (09/16/2016), and Ulcerative colitis ([facility].  Surgical History Patient  has a past surgical history that includes Cesarean section; Abdominal hysterectomy; Shoulder surgery (Right); Knee surgery (Left); Cataract extraction; Breast biopsy (Left); and Colonoscopy (2012,2015,2016).  Family History Pateint's family history includes Breast cancer in her cousin; COPD in her father; Cancer in her brother; Colon cancer (age of onset:  555 in her brother; Coronary artery disease in her mother; Dementia in her mother; Diabetes in her mother; Diverticulitis in her brother; Stroke in her mother.  Social History Patient  reports that she has never smoked. She has never used smokeless tobacco. She reports that she does not drink alcohol or use drugs.    Objective: Vitals:   04/30/19 1320  BP: 125/68  Pulse: 71  Temp: 98.8 F (37.1 C)  TempSrc: Skin  Weight: 183 lb (83 kg)  Height: 4' 11"  (1.499 m)    Body mass index is 36.96 kg/m.  Physical Exam Vitals signs reviewed.  Constitutional:      Appearance: Normal appearance.  HENT:     Head: Normocephalic and atraumatic.  Pulmonary:     Effort: Pulmonary effort is normal.  Neurological:     General: No focal deficit present.     Mental Status: She is alert and oriented to person, place, and time.  Psychiatric:        Mood and Affect: Mood normal.        Behavior: Behavior normal.        Depression screen [facility] 2/9 04/30/2019 12/29/2018  Decreased Interest 0 0  Down, Depressed, Hopeless 1 0  PHQ - 2 Score 1 0  Altered sleeping 0 0  Tired, decreased energy 0 0  Change in appetite 0 0  Feeling bad or failure about yourself  0 0  Trouble concentrating 1 0  Moving slowly or fidgety/restless 0 0  Suicidal thoughts 0 0  PHQ-9 Score 2 0  Difficult doing work/chores Not difficult at all Not difficult at all     Assessment/plan: 1. Hypertension associated with diabetes ([place]) .Blood pressure is to goal. Continue current anti-hypertensive medications. Refills not needed and lab work reviewed form 12/2018. Recommended routine exercise and healthy diet including DASH diet and mediterranean diet. Encouraged weight loss. F/u in 3 months.    2. Depression, recurrent ([place]) Extremely well controlled and her phq9 is a 2. She doesn't think she is depressed and isn't even sure why she is on medication. We are going to decrease her wellbutrin down to 129m and see how she does.  Will f/u in 3 months and see about stopping completely. If any issues let me know.   3. Type 2 diabetes mellitus with diabetic neuropathy, without long-term current use of insulin ([place] extremely well controlled. a1c of 6.0. not on statin, but we will discuss when she comes in as I do not want to change too much on first meeting over phone. Also will need her pneumovax 23, otherwise continue current plan and see her back for routine labs in 3 months.    Return in about 3 months (around 07/29/2019) for labs/regular follo wup .  Records requested if needed. Time spent with patient: 25 minutes, of which >50% was spent in obtaining information about her symptoms, reviweing her previous labs, evaluations, and treatments, counseling her about her conditions (please see discussed topics above), and developing a plan to further investigate it; she had a number of questions which I addressed.    AOrma Flaming MD [facility] 04/30/2019

## 2019-05-08 DIAGNOSIS — K5190 Ulcerative colitis, unspecified, without complications: Secondary | ICD-10-CM | POA: Diagnosis not present

## 2019-05-16 ENCOUNTER — Other Ambulatory Visit: Payer: Self-pay | Admitting: Family Medicine

## 2019-05-21 ENCOUNTER — Other Ambulatory Visit: Payer: Self-pay | Admitting: Family Medicine

## 2019-05-21 DIAGNOSIS — J301 Allergic rhinitis due to pollen: Secondary | ICD-10-CM

## 2019-06-16 ENCOUNTER — Other Ambulatory Visit: Payer: Self-pay | Admitting: Family Medicine

## 2019-06-16 DIAGNOSIS — J301 Allergic rhinitis due to pollen: Secondary | ICD-10-CM

## 2019-06-17 NOTE — Telephone Encounter (Signed)
Dr.Wolfe's patient.

## 2019-07-27 ENCOUNTER — Other Ambulatory Visit: Payer: Self-pay

## 2019-07-27 ENCOUNTER — Telehealth: Payer: Self-pay | Admitting: Family Medicine

## 2019-07-27 MED ORDER — GABAPENTIN 300 MG PO CAPS: 600.0000 mg | ORAL_CAPSULE | Freq: Every day | ORAL | 0 refills | Status: DC

## 2019-07-27 MED ORDER — GABAPENTIN 100 MG PO CAPS: 100.0000 mg | ORAL_CAPSULE | Freq: Every morning | ORAL | 0 refills | Status: DC

## 2019-07-27 NOTE — Telephone Encounter (Signed)
Sent to pharmacy 

## 2019-07-27 NOTE — Telephone Encounter (Signed)
  LAST APPOINTMENT DATE: 06/16/2019   NEXT APPOINTMENT DATE:@3 /19/2021  MEDICATION:gabapentin (NEURONTIN) 300 MG capsule-gabapentin (NEURONTIN) 100 MG capsule  [id number] [place], [place] - [address]  **Let patient know to contact pharmacy at the end of the day to make sure medication is ready. **  ** Please notify patient to allow 48-72 hours to process**  **Encourage patient to contact the pharmacy for refills or [facility]  CLINICAL FILLS OUT ALL BELOW:   LAST REFILL:  QTY:  REFILL DATE:    OTHER COMMENTS:    Okay for refill?  Please advise

## 2019-08-06 ENCOUNTER — Telehealth: Payer: Self-pay | Admitting: Family Medicine

## 2019-08-06 NOTE — Telephone Encounter (Signed)
Looks like she got prevnar 13 05/16/2019, so she can get her pneumovax now at her appointment. Just will give when she is at appointment.   Orma Flaming, MD [place]

## 2019-08-06 NOTE — Telephone Encounter (Signed)
Patient is calling in saying she was told to call a week prior to her appointment in order to receive the  2nd Prevnar 13 and wants to know if she will be able to receive it next Friday.

## 2019-08-06 NOTE — Telephone Encounter (Signed)
Called patient gave all information if she does not get covid vaccination she will get at next ov. I have made note in app for reminder.

## 2019-08-14 ENCOUNTER — Encounter: Payer: Self-pay | Admitting: Family Medicine

## 2019-08-14 ENCOUNTER — Ambulatory Visit (INDEPENDENT_AMBULATORY_CARE_PROVIDER_SITE_OTHER): Payer: BC Managed Care – PPO | Admitting: Family Medicine

## 2019-08-14 ENCOUNTER — Other Ambulatory Visit: Payer: Self-pay

## 2019-08-14 VITALS — BP 136/80 | HR 72 | Temp 97.6°F | Ht 59.0 in | Wt 192.8 lb

## 2019-08-14 DIAGNOSIS — I152 Hypertension secondary to endocrine disorders: Secondary | ICD-10-CM

## 2019-08-14 DIAGNOSIS — F339 Major depressive disorder, recurrent, unspecified: Secondary | ICD-10-CM | POA: Diagnosis not present

## 2019-08-14 DIAGNOSIS — M205X2 Other deformities of toe(s) (acquired), left foot: Secondary | ICD-10-CM | POA: Diagnosis not present

## 2019-08-14 DIAGNOSIS — R252 Cramp and spasm: Secondary | ICD-10-CM | POA: Diagnosis not present

## 2019-08-14 DIAGNOSIS — E1159 Type 2 diabetes mellitus with other circulatory complications: Secondary | ICD-10-CM | POA: Diagnosis not present

## 2019-08-14 DIAGNOSIS — E1140 Type 2 diabetes mellitus with diabetic neuropathy, unspecified: Secondary | ICD-10-CM

## 2019-08-14 DIAGNOSIS — I10 Essential (primary) hypertension: Secondary | ICD-10-CM

## 2019-08-14 LAB — CBC WITH DIFFERENTIAL/PLATELET
Basophils Absolute: 0.1 10*3/uL (ref 0.0–0.1)
Basophils Relative: 1.3 % (ref 0.0–3.0)
Eosinophils Absolute: 0.3 10*3/uL (ref 0.0–0.7)
Eosinophils Relative: 3.5 % (ref 0.0–5.0)
HCT: 40.9 % (ref 36.0–46.0)
Hemoglobin: 13.4 g/dL (ref 12.0–15.0)
Lymphocytes Relative: 30.8 % (ref 12.0–46.0)
Lymphs Abs: 2.3 10*3/uL (ref 0.7–4.0)
MCHC: 32.8 g/dL (ref 30.0–36.0)
MCV: 92.7 fl (ref 78.0–100.0)
Monocytes Absolute: 0.5 10*3/uL (ref 0.1–1.0)
Monocytes Relative: 6.9 % (ref 3.0–12.0)
Neutro Abs: 4.2 10*3/uL (ref 1.4–7.7)
Neutrophils Relative %: 57.5 % (ref 43.0–77.0)
Platelets: 291 10*3/uL (ref 150.0–400.0)
RBC: 4.42 Mil/uL (ref 3.87–5.11)
RDW: 13.5 % (ref 11.5–15.5)
WBC: 7.4 10*3/uL (ref 4.0–10.5)

## 2019-08-14 LAB — MAGNESIUM: Magnesium: 1.9 mg/dL (ref 1.5–2.5)

## 2019-08-14 LAB — MICROALBUMIN / CREATININE URINE RATIO
Creatinine,U: 142.5 mg/dL
Microalb Creat Ratio: 0.5 mg/g (ref 0.0–30.0)
Microalb, Ur: <0.7 mg/dL (ref 0.0–1.9)

## 2019-08-14 LAB — COMPREHENSIVE METABOLIC PANEL
ALT: 18 U/L (ref 0–35)
AST: 22 U/L (ref 0–37)
Albumin: 3.9 g/dL (ref 3.5–5.2)
Alkaline Phosphatase: 75 U/L (ref 39–117)
BUN: 25 mg/dL — ABNORMAL HIGH (ref 6–23)
CO2: 27 mEq/L (ref 19–32)
Calcium: 9.4 mg/dL (ref 8.4–10.5)
Chloride: 104 mEq/L (ref 96–112)
Creatinine, Ser: 1.16 mg/dL (ref 0.40–1.20)
GFR: 46.9 mL/min — ABNORMAL LOW (ref >60.00)
Glucose, Bld: 118 mg/dL — ABNORMAL HIGH (ref 70–99)
Potassium: 4.7 mEq/L (ref 3.5–5.1)
Sodium: 139 mEq/L (ref 135–145)
Total Bilirubin: 0.5 mg/dL (ref 0.2–1.2)
Total Protein: 6.4 g/dL (ref 6.0–8.3)

## 2019-08-14 LAB — TSH: TSH: 1.81 u[IU]/mL (ref 0.35–4.50)

## 2019-08-14 LAB — HEMOGLOBIN A1C: Hgb A1c MFr Bld: 6.2 % (ref 4.6–6.5)

## 2019-08-14 LAB — VITAMIN D 25 HYDROXY (VIT D DEFICIENCY, FRACTURES): VITD: 86.41 ng/mL (ref 30.00–100.00)

## 2019-08-14 LAB — CK: Total CK: 93 U/L (ref 7–177)

## 2019-08-14 MED ORDER — ROSUVASTATIN CALCIUM 5 MG PO TABS: 5.0000 mg | ORAL_TABLET | Freq: Every day | ORAL | 3 refills | Status: DC

## 2019-08-14 MED ORDER — GABAPENTIN 300 MG PO CAPS: 600.0000 mg | ORAL_CAPSULE | Freq: Every day | ORAL | 1 refills | Status: DC

## 2019-08-14 MED ORDER — GABAPENTIN 100 MG PO CAPS: 100.0000 mg | ORAL_CAPSULE | Freq: Two times a day (BID) | ORAL | 1 refills | Status: DC

## 2019-08-14 NOTE — Patient Instructions (Signed)
1) we are going to wean you off wellbutrin. Do 1/2 tab for next 3-4 weeks then every other day for a few weeks then stop. If any issues would just start back up and let me know.   2) referral done to dr. Sharol Given 3) sent in refill of your gabapentin   4) starting new cholesterol drug called crestor. Take this at night. Any increased cramping, please let me know. Need to be on this with diabetes history   It's so nice to see you in person!  You're doing great!  Depending on your a1c see you back in 3-6 months,  Dr. Rogers Blocker

## 2019-08-14 NOTE — Progress Notes (Signed)
Patient: Tara Garner MRN: [id number] DOB: Mar 08, 1956 PCP: Orma Flaming, MD     Subjective:  Chief Complaint  Patient presents with  . Hypertension    3 month f/u  . Depression  . Diabetes  . Club Foot    Bilateral;Pt would like to discuss options for surgery.    HPI: The patient is a 64 y.o. female who presents today for   Hypertension: Here for follow up of hypertension.  Currently on metoprolol 122m.  Takes medication as prescribed and denies any side effects. Exercise includes none. Weight has been increasing. Denies any chest pain, headaches, shortness of breath, vision changes, swelling in lower extremities.   Diabetes: Patient is here for follow up of type 2 diabetes. First diagnosed 2007.  Currently on the following medications victoza. Takes medications as prescribed. Last A1C was 6.0. Currently not exercising and following diabetic diet. Denies any hypoglycemic events. Denies any vision changes, nausea, vomiting, abdominal pain, ulcers/paraesthesia in feet, polyuria, polydipsia or polyphagia. Denies any chest pain, shortness of breath. Not on statin drug. shes never been on one in the past.   The 10-year ASCVD risk score (Mikey BussingDC JBrooke Bonito, et al., 2013) is: 12.2%   Depression: I had her wean down her wellbutrin to 1527mon last visit in December. She is doing well and can tell no difference. She is ready to wean some more.   Claw toe in left foot and right foot is severely flat: she states they both need surgery. She has seen dr. duSharol Givenn the past. She can't put this off anymore. She has constant pain in her feet that progresses through the day. It limits her exercise. She is on gabapentin and this does seem to help her some.   Having increased cramping in her legs.   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   Review of Systems  Constitutional: Negative for chills, fatigue and fever.  HENT: Negative for congestion, dental problem, ear  pain, hearing loss, sinus pressure, sore throat and trouble swallowing.   Eyes: Negative for visual disturbance.  Respiratory: Negative for cough, chest tightness and shortness of breath.   Cardiovascular: Negative for chest pain, palpitations and leg swelling.  Gastrointestinal: Negative for abdominal pain, blood in stool, diarrhea and nausea.  Endocrine: Negative for cold intolerance, polydipsia, polyphagia and polyuria.  Genitourinary: Negative for dysuria, frequency, hematuria and urgency.  Musculoskeletal: Positive for arthralgias (feet: claw toe deformity ) and myalgias (leg cramping ).  Skin: Negative for rash.  Neurological: Negative for dizziness, light-headedness and headaches.  Psychiatric/Behavioral: Negative for dysphoric mood and sleep disturbance. The patient is not nervous/anxious.     Allergies Patient is allergic to macrobid [nitrofurantoin macrocrystal]; augmentin [amoxicillin-pot clavulanate]; avelox [moxifloxacin]; benicar [olmesartan]; ceftin [cefuroxime]; codeine; imuran [azathioprine]; indocin [indomethacin]; and methotrexate derivatives.  Past Medical History Patient  has a past medical history of Acid reflux, Arthritis, Asthma, Cough due to ACE inhibitor, Depression, recurrent ([facility](09/16/2016), Diabetes mellitus without complication ([facility] DVT (deep venous thrombosis) ([facility] Hypertension, Morbid obesity ([facility](09/16/2016), Seasonal allergic rhinitis due to pollen (09/16/2016), and Ulcerative colitis ([facility]  Surgical History Patient  has a past surgical history that includes Cesarean section; Abdominal hysterectomy; Shoulder surgery (Right); Knee surgery (Left); Cataract extraction; Breast biopsy (Left); and Colonoscopy (2012,2015,2016).  Family History Pateint's family history includes Breast cancer in her cousin; COPD in her father; Cancer in her brother; Colon cancer (age of onset: 5028in her brother; Coronary artery disease in her mother;  Dementia in her mother;  Diabetes in her mother; Diverticulitis in her brother; Stroke in her mother.  Social History Patient  reports that she has never smoked. She has never used smokeless tobacco. She reports that she does not drink alcohol or use drugs.    Objective: Vitals:   08/14/19 0829  BP: 136/80  Pulse: 72  Temp: 97.6 F (36.4 C)  TempSrc: Temporal  SpO2: 97%  Weight: 192 lb 12.8 oz (87.5 kg)  Height: 4' 11"  (1.499 m)    Body mass index is 38.94 kg/m.  Physical Exam Vitals reviewed.  Constitutional:      Appearance: Normal appearance. She is well-developed. She is obese.  HENT:     Head: Normocephalic and atraumatic.     Right Ear: Tympanic membrane, ear canal and external ear normal.     Left Ear: Tympanic membrane, ear canal and external ear normal.     Mouth/Throat:     Mouth: Mucous membranes are moist.  Eyes:     Extraocular Movements: Extraocular movements intact.     Conjunctiva/sclera: Conjunctivae normal.     Pupils: Pupils are equal, round, and reactive to light.  Neck:     Thyroid: No thyromegaly.     Vascular: No carotid bruit.  Cardiovascular:     Rate and Rhythm: Normal rate and regular rhythm.     Heart sounds: Normal heart sounds. No murmur.  Pulmonary:     Effort: Pulmonary effort is normal.     Breath sounds: Normal breath sounds.  Abdominal:     General: Bowel sounds are normal. There is no distension.     Palpations: Abdomen is soft.     Tenderness: There is no abdominal tenderness.  Musculoskeletal:     Cervical back: Normal range of motion and neck supple.  Lymphadenopathy:     Cervical: No cervical adenopathy.  Skin:    General: Skin is warm and dry.     Capillary Refill: Capillary refill takes less than 2 seconds.     Findings: No rash.  Neurological:     General: No focal deficit present.     Mental Status: She is alert and oriented to person, place, and time.     Cranial Nerves: No cranial nerve deficit.     Coordination: Coordination normal.      Deep Tendon Reflexes: Reflexes normal.  Psychiatric:        Mood and Affect: Mood normal.        Behavior: Behavior normal.          Office Visit from 08/14/2019 in [facility]  PHQ-9 Total Score  0      Assessment/plan: 1. Type 2 diabetes mellitus with diabetic neuropathy, without long-term current use of insulin (HCC) Last a1c was well controlled. Routine lab work today. UTD on HM. First covid shot yesterday. She has never been on statin and I prescribed this for her today. Discussed side effects and if any issues she can stop and let me know. Discussed guidelines and indication for statin use with diabetes. ascvd risk is also >7.5%. f/u in 3-6 months depending on a1c.  - CBC with Differential/Platelet - Comprehensive metabolic panel - Hemoglobin A1c - Microalbumin / creatinine urine ratio - TSH  2. Hypertension associated with diabetes ([place]) Blood pressure is to goal. Continue current anti-hypertensive medications. Refills not given and routine lab work will be done today. Recommended routine exercise and healthy diet including DASH diet and mediterranean diet. Encouraged weight loss. F/u  in 6 months.    3. Depression, recurrent (HCC) phq9 score of zero. Will have her continue to wean down off wellbutrin. Advised her to cut in half for a month then every other day for a few weeks then stop. Any issues she is to let me know. F/u in 3-6 months.   4. Acquired claw toe, left  - Ambulatory referral to Orthopedic Surgery - VITAMIN D 25 Hydroxy (Vit-D Deficiency, Fractures)  5. Leg cramping  Increase water and showed her stretches to do at home. Let me know if not getting better. Checking labs today as well. Precautions given with addition of statin.    This visit occurred during the SARS-CoV-2 public health emergency.  Safety protocols were in place, including screening questions prior to the visit, additional usage of staff PPE, and extensive cleaning of  exam room while observing appropriate contact time as indicated for disinfecting solutions.     Return in about 6 months (around 02/14/2020) for diabetic follow up and fasting labs .   Orma Flaming, MD [place]   08/14/2019

## 2019-08-27 ENCOUNTER — Encounter: Payer: Self-pay | Admitting: Orthopedic Surgery

## 2019-08-27 ENCOUNTER — Other Ambulatory Visit: Payer: Self-pay

## 2019-08-27 ENCOUNTER — Ambulatory Visit (INDEPENDENT_AMBULATORY_CARE_PROVIDER_SITE_OTHER): Payer: BC Managed Care – PPO | Admitting: Orthopedic Surgery

## 2019-08-27 ENCOUNTER — Ambulatory Visit: Payer: Self-pay

## 2019-08-27 VITALS — Ht 59.0 in | Wt 192.8 lb

## 2019-08-27 DIAGNOSIS — M79671 Pain in right foot: Secondary | ICD-10-CM

## 2019-08-27 DIAGNOSIS — M79672 Pain in left foot: Secondary | ICD-10-CM | POA: Diagnosis not present

## 2019-09-01 ENCOUNTER — Encounter: Payer: Self-pay | Admitting: Orthopedic Surgery

## 2019-09-01 NOTE — Progress Notes (Signed)
Office Visit Note   Patient: Tara Garner           Date of Birth: [birth year]           MRN: [id number] Visit Date: 08/27/2019              Requested by: Orma Flaming, [address] [place],  [place] [address] PCP: Orma Flaming, MD  No chief complaint on file.     HPI: Patient is a 64 year old woman who presents complaining of bilateral foot pain pain with walking longer distances.  She also complains of clawing of her toes.  Assessment & Plan: Visit Diagnoses:  1. Pain in left foot   2. Pain in right foot     Plan: Patient has arthritis through the midfoot she does have diabetic neuropathy with peripheral vascular disease and calcification of her arteries.  Have recommended against surgical intervention due to the multiple joints involved throughout the midfoot.  Recommended a stiff soled sneaker with over-the-counter orthotics.  Follow-Up Instructions: Return if symptoms worsen or fail to improve.   Ortho Exam  Patient is alert, oriented, no adenopathy, well-dressed, normal affect, normal respiratory effort. Examination patient has pes planus bilaterally with clawing of her toes.  She has a good dorsalis pedis pulse bilaterally.  She has pain to palpation across the midfoot bilaterally radiographs show spurring at the calcaneocuboid joint bilaterally as well as talonavicular spurring and spurring across the midfoot.  Patient does have neuropathy from her diabetes and is currently on Neurontin.  She has pes planus bilaterally with less clawing on the right foot she has no tenderness to palpation over the posterior tibial tendon bilaterally.  Imaging: No results found. No images are attached to the encounter.  Labs: Lab Results  Component Value Date   HGBA1C 6.2 08/14/2019   HGBA1C 6.0 12/29/2018   HGBA1C 6.1 05/12/2018   REPTSTATUS 01/07/2019 FINAL 01/06/2019   CULT (A) 01/06/2019    <10,000 COLONIES/mL INSIGNIFICANT GROWTH Performed at [facility], [place] [address] [place], [place] [address]      Lab Results  Component Value Date   ALBUMIN 3.9 08/14/2019   ALBUMIN 4.3 12/29/2018   ALBUMIN 3.9 05/12/2018    Lab Results  Component Value Date   MG 1.9 08/14/2019   Lab Results  Component Value Date   VD25OH 86.41 08/14/2019   VD25OH 44 01/10/2016    No results found for: PREALBUMIN CBC EXTENDED Latest Ref Rng & Units 08/14/2019 01/06/2019 12/29/2018  WBC 4.0 - 10.5 K/uL 7.4 7.0 8.0  RBC 3.87 - 5.11 Mil/uL 4.42 4.29 4.50  HGB 12.0 - 15.0 g/dL 13.4 12.8 13.6  HCT 36.0 - 46.0 % 40.9 40.3 41.6  PLT 150.0 - 400.0 K/uL 291.0 255 285.0  NEUTROABS 1.4 - 7.7 K/uL 4.2 3.9 4.3  LYMPHSABS 0.7 - 4.0 K/uL 2.3 2.3 2.6     Body mass index is 38.94 kg/m.  Orders:  Orders Placed This Encounter  Procedures  . XR Foot Complete Left  . XR Foot 2 Views Right   No orders of the defined types were placed in this encounter.    Procedures: No procedures performed  Clinical Data: No additional findings.  ROS:  All other systems negative, except as noted in the HPI. Review of Systems  Objective: Vital Signs: Ht 4' 11"  (1.499 m)   Wt 192 lb 12.8 oz (87.5 kg)   LMP  (LMP Unknown)   BMI 38.94 kg/m   Specialty Comments:  No specialty comments available.  PMFS History: Patient Active Problem List   Diagnosis Date Noted  . Type 2 diabetes mellitus with diabetic neuropathy, unspecified ([place]) 04/30/2019  . Sensorineural hearing loss 01/29/2019  . Acquired claw toe, left 09/16/2017  . Posterior tibial tendinitis, right leg 09/16/2017  . Localized osteoarthritis of right shoulder 10/31/2016  . Diabetic peripheral neuropathy associated with type 2 diabetes mellitus ([place]) 10/23/2016  . Morbid obesity ([place]) 09/16/2016  . Seasonal allergic rhinitis due to pollen 09/16/2016  . Gastroesophageal reflux disease without esophagitis 09/16/2016  . Depression, recurrent ([place]) 09/16/2016  . Hypertension associated with diabetes ([place])  09/16/2016  . Moderate persistent asthma without complication 94/76/5465  . Ulcerative colitis with complication ([place]) 03/54/6568   Past Medical History:  Diagnosis Date  . Acid reflux   . Arthritis   . Asthma   . Cough due to ACE inhibitor   . Depression, recurrent ([place]) 09/16/2016  . Diabetes mellitus without complication ([place])   . DVT (deep venous thrombosis) ([place])   . Hypertension   . Morbid obesity ([place]) 09/16/2016  . Seasonal allergic rhinitis due to pollen 09/16/2016  . Ulcerative colitis ([place])     Family History  Problem Relation Age of Onset  . Diabetes Mother   . Dementia Mother   . Stroke Mother   . Coronary artery disease Mother   . Cancer Brother   . Diverticulitis Brother   . Colon cancer Brother 20  . COPD Father   . Breast cancer Cousin   . Rectal cancer Neg Hx   . Stomach cancer Neg Hx     Past Surgical History:  Procedure Laterality Date  . ABDOMINAL HYSTERECTOMY    . BREAST BIOPSY Left    benign  . CATARACT EXTRACTION    . CESAREAN SECTION    . COLONOSCOPY  2012,2015,2016   Dr.Andreson and in [place]   . KNEE SURGERY Left   . SHOULDER SURGERY Right    Social History   Occupational History  . Not on file  Tobacco Use  . Smoking status: Never Smoker  . Smokeless tobacco: Never Used  Substance and Sexual Activity  . Alcohol use: No  . Drug use: No  . Sexual activity: Not on file

## 2019-09-03 ENCOUNTER — Other Ambulatory Visit: Payer: Self-pay

## 2019-09-03 ENCOUNTER — Other Ambulatory Visit: Payer: Self-pay | Admitting: Family Medicine

## 2019-09-03 ENCOUNTER — Ambulatory Visit (INDEPENDENT_AMBULATORY_CARE_PROVIDER_SITE_OTHER): Payer: BC Managed Care – PPO | Admitting: Family Medicine

## 2019-09-03 ENCOUNTER — Encounter: Payer: Self-pay | Admitting: Family Medicine

## 2019-09-03 VITALS — BP 128/74 | HR 79 | Temp 97.2°F | Ht 59.0 in | Wt 194.6 lb

## 2019-09-03 DIAGNOSIS — L989 Disorder of the skin and subcutaneous tissue, unspecified: Secondary | ICD-10-CM

## 2019-09-03 DIAGNOSIS — R635 Abnormal weight gain: Secondary | ICD-10-CM | POA: Diagnosis not present

## 2019-09-03 DIAGNOSIS — L570 Actinic keratosis: Secondary | ICD-10-CM | POA: Diagnosis not present

## 2019-09-03 MED ORDER — BUPROPION HCL ER (XL) 150 MG PO TB24: 150.0000 mg | ORAL_TABLET | Freq: Every day | ORAL | 1 refills | Status: DC

## 2019-09-03 NOTE — Progress Notes (Addendum)
Patient: Tara Garner MRN: [id number] DOB: 24-Aug-1955 PCP: Orma Flaming, MD     Subjective:  Chief Complaint  Patient presents with  . spot on back    Patient noticed the spot on Monday. Spot is located towards the middle upper back where her bra strap. Looks like it has scabbed over a little. No itching, some redness. She hasn't applied anything to it.     HPI: The patient is a 64 y.o. female who presents today for spot on her back that she noticed on Monday. She felt like she laid on something and it was a little itchy so she had her husband look and he took a picture of it. She is not sure if it has grown. It is not bleeding, but does have a scab. No hx of skin cancer.   Also has noticed she has been gaining weight since decreasing wellbutrin and does not like this. Is interested in staying on medication for weight.   Review of Systems  Constitutional: Negative for appetite change, chills, fatigue and fever.  HENT: Negative for congestion, ear discharge, nosebleeds and sore throat.   Eyes: Negative for pain, discharge and itching.  Respiratory: Negative for apnea, cough, chest tightness, shortness of breath and wheezing.   Cardiovascular: Negative for chest pain and leg swelling.  Gastrointestinal: Negative for abdominal pain, constipation, diarrhea, nausea and vomiting.  Endocrine: Negative for cold intolerance, heat intolerance and polyphagia.  Genitourinary: Negative for difficulty urinating, flank pain, frequency, pelvic pain and vaginal pain.  Musculoskeletal: Positive for back pain and joint swelling.       Patient has a history of arthritis   Skin: Positive for rash.       Patient has spot located on her mid-upper back  Neurological: Negative for dizziness, seizures, light-headedness and headaches.  Hematological: Does not bruise/bleed easily.  Psychiatric/Behavioral: Negative for agitation, behavioral problems, hallucinations, sleep disturbance and suicidal ideas.     Allergies Patient is allergic to macrobid [nitrofurantoin macrocrystal]; augmentin [amoxicillin-pot clavulanate]; avelox [moxifloxacin]; benicar [olmesartan]; ceftin [cefuroxime]; codeine; imuran [azathioprine]; indocin [indomethacin]; and methotrexate derivatives.  Past Medical History Patient  has a past medical history of Acid reflux, Arthritis, Asthma, Cough due to ACE inhibitor, Depression, recurrent ([place]) (09/16/2016), Diabetes mellitus without complication ([place]), DVT (deep venous thrombosis) ([place]), Hypertension, Morbid obesity ([place]) (09/16/2016), Seasonal allergic rhinitis due to pollen (09/16/2016), and Ulcerative colitis ([place]).  Surgical History Patient  has a past surgical history that includes Cesarean section; Abdominal hysterectomy; Shoulder surgery (Right); Knee surgery (Left); Cataract extraction; Breast biopsy (Left); and Colonoscopy (2012,2015,2016).  Family History Pateint's family history includes Breast cancer in her cousin; COPD in her father; Cancer in her brother; Colon cancer (age of onset: 66) in her brother; Coronary artery disease in her mother; Dementia in her mother; Diabetes in her mother; Diverticulitis in her brother; Stroke in her mother.   Social History Patient  reports that she has never smoked. She has never used smokeless tobacco. She reports that she does not drink alcohol or use drugs.    Objective: Vitals:   09/03/19 0913  BP: 128/74  Pulse: 79  Temp: (!) 97.2 F (36.2 C)  TempSrc: Temporal  SpO2: 95%  Weight: 194 lb 9.6 oz (88.3 kg)  Height: 4' 11"  (1.499 m)    Body mass index is 39.3 kg/m.  Physical Exam Vitals reviewed.  Constitutional:      Appearance: Normal appearance. She is obese.  Skin:    Findings: Lesion (middle of back at bra line.  flat, flesh-pink colored lesion with scab. about 1cm in length. ) present.  Neurological:     Mental Status: She is alert.    Procedure note: written and verbal consent obtained. Area prepped  in sterile fashion. 1.10m of lidocaine with epi administered for local anesthetic. Shave biopsy performed with no complications. Minimal bleeding. drysol applied. Dressed with polysporin and band aid. Wound care instructions given.     Assessment/plan: 1. Skin lesion of back Shave biopsy with path. Likely irritated SK, but r/o SCC. Wound care instructions given. Will f/u with path.  - Surgical pathology( [facility]/ [facility])  2. Increased weight gain: can continue Wellbutrin for weight and will increase her dose back up due to considerable weight gain. 1562mdosage sent back in for her to resume.    This visit occurred during the SARS-CoV-2 public health emergency.  Safety protocols were in place, including screening questions prior to the visit, additional usage of staff PPE, and extensive cleaning of exam room while observing appropriate contact time as indicated for disinfecting solutions.    Return if symptoms worsen or fail to improve.   AlOrma FlamingMD [place]4/12/2019

## 2019-09-03 NOTE — Patient Instructions (Signed)
Skin Biopsy, Care After This sheet gives you information about how to care for yourself after your procedure. Your health care provider may also give you more specific instructions. If you have problems or questions, contact your health care provider. What can I expect after the procedure? After the procedure, it is common to have:  Soreness.  Bruising.  Itching. Follow these instructions at home: Biopsy site care Follow instructions from your health care provider about how to take care of your biopsy site. Make sure you:  Wash your hands with soap and water before and after you change your bandage (dressing). If soap and water are not available, use hand sanitizer.  Apply ointment on your biopsy site as directed by your health care provider.  Change your dressing as told by your health care provider.  Leave stitches (sutures), skin glue, or adhesive strips in place. These skin closures may need to stay in place for 2 weeks or longer. If adhesive strip edges start to loosen and curl up, you may trim the loose edges. Do not remove adhesive strips completely unless your health care provider tells you to do that.  If the biopsy area bleeds, apply gentle pressure for 10 minutes. Check your biopsy site every day for signs of infection. Check for:  Redness, swelling, or pain.  Fluid or blood.  Warmth.  Pus or a bad smell.  General instructions  Rest and then return to your normal activities as told by your health care provider.  Take over-the-counter and prescription medicines only as told by your health care provider.  Keep all follow-up visits as told by your health care provider. This is important. Contact a health care provider if:  You have redness, swelling, or pain around your biopsy site.  You have fluid or blood coming from your biopsy site.  Your biopsy site feels warm to the touch.  You have pus or a bad smell coming from your biopsy site.  You have a  fever.  Your sutures, skin glue, or adhesive strips loosen or come off sooner than expected. Get help right away if:  You have bleeding that does not stop with pressure or a dressing. Summary  After the procedure, it is common to have soreness, bruising, and itching at the site.  Follow instructions from your health care provider about how to take care of your biopsy site.  Check your biopsy site every day for signs of infection.  Contact a health care provider if you have redness, swelling, or pain around your biopsy site, or your biopsy site feels warm to the touch.  Keep all follow-up visits as told by your health care provider. This is important. This information is not intended to replace advice given to you by your health care provider. Make sure you discuss any questions you have with your health care provider. Document Revised: 11/11/2017 Document Reviewed: 11/11/2017 Elsevier Patient Education  [place].

## 2019-10-12 ENCOUNTER — Encounter: Payer: Self-pay | Admitting: Family Medicine

## 2019-10-12 ENCOUNTER — Other Ambulatory Visit: Payer: Self-pay

## 2019-10-12 ENCOUNTER — Ambulatory Visit (INDEPENDENT_AMBULATORY_CARE_PROVIDER_SITE_OTHER): Payer: BC Managed Care – PPO | Admitting: Family Medicine

## 2019-10-12 VITALS — BP 120/78 | HR 78 | Temp 97.8°F | Ht 59.0 in | Wt 196.4 lb

## 2019-10-12 DIAGNOSIS — L570 Actinic keratosis: Secondary | ICD-10-CM | POA: Diagnosis not present

## 2019-10-12 DIAGNOSIS — R29898 Other symptoms and signs involving the musculoskeletal system: Secondary | ICD-10-CM | POA: Diagnosis not present

## 2019-10-12 DIAGNOSIS — M898X1 Other specified disorders of bone, shoulder: Secondary | ICD-10-CM

## 2019-10-12 MED ORDER — TIZANIDINE HCL 2 MG PO CAPS: 2.0000 mg | ORAL_CAPSULE | Freq: Three times a day (TID) | ORAL | 1 refills | Status: DC

## 2019-10-12 NOTE — Progress Notes (Signed)
Patient: Tara Garner MRN: [id number] DOB: 22-Jan-1956 PCP: Orma Flaming, MD     Subjective:  Chief Complaint  Patient presents with  . Skin Lesions    on the back.  . Arm Pain    Pt fell on right arm    HPI: The patient is a 64 y.o. female who presents today to remove skin lesions from her back. I biopsied lesion off her back and came back as an AK. She is here to freeze this today.   I increased her back up to 137m of her wellbutrin and she is doing better on this. She has lost some weight since increasing this back up.   She also fell while she was visiting family and she can not get up on her own. She is concerned about this and wants to know what she can do. She states her arms are too weak to push herself up. She feels like her strength is what is keeping her from getting up. She would also love to be able to get on floor and play with her grandkids.   She fell about a week ago onto her right side. She is having pain on the right side of her back under her shoulder blade. She is right handed. Pain is rated as a 5/10. It does not radiate. She describes the pain as hurting to take deep breaths. It is more than an ache. She has tried tylenol and heating pad with limited relief.   I put her on crestor 550m She states the leg cramps were so bad that she had to stop this. Her symptoms resolved after she stopped this.   Review of Systems  Constitutional: Negative for chills, fatigue and fever.  HENT: Negative for dental problem, ear pain, hearing loss and trouble swallowing.   Eyes: Negative for visual disturbance.  Respiratory: Negative for cough, chest tightness and shortness of breath.   Cardiovascular: Negative for chest pain, palpitations and leg swelling.  Gastrointestinal: Negative for abdominal pain, blood in stool, diarrhea and nausea.  Endocrine: Negative for cold intolerance, polydipsia, polyphagia and polyuria.  Genitourinary: Negative for dysuria and hematuria.   Musculoskeletal: Positive for myalgias (right shoulder blade area ). Negative for arthralgias.  Skin: Negative for rash.  Neurological: Positive for weakness (all extremities ). Negative for dizziness and headaches.  Psychiatric/Behavioral: Negative for dysphoric mood and sleep disturbance. The patient is not nervous/anxious.     Allergies Patient is allergic to macrobid [nitrofurantoin macrocrystal]; augmentin [amoxicillin-pot clavulanate]; avelox [moxifloxacin]; benicar [olmesartan]; ceftin [cefuroxime]; codeine; crestor [rosuvastatin]; imuran [azathioprine]; indocin [indomethacin]; and methotrexate derivatives.  Past Medical History Patient  has a past medical history of Acid reflux, Arthritis, Asthma, Cough due to ACE inhibitor, Depression, recurrent ([place](09/16/2016), Diabetes mellitus without complication (HCK[place] DVT (deep venous thrombosis) (H[facility] Hypertension, Morbid obesity (HC[facility](09/16/2016), Seasonal allergic rhinitis due to pollen (09/16/2016), and Ulcerative colitis (HCM[place]  Surgical History Patient  has a past surgical history that includes Cesarean section; Abdominal hysterectomy; Shoulder surgery (Right); Knee surgery (Left); Cataract extraction; Breast biopsy (Left); and Colonoscopy (2012,2015,2016).  Family History Pateint's family history includes Breast cancer in her cousin; COPD in her father; Cancer in her brother; Colon cancer (age of onset: 5065in her brother; Coronary artery disease in her mother; Dementia in her mother; Diabetes in her mother; Diverticulitis in her brother; Stroke in her mother.  Social History Patient  reports that she has never smoked. She has never used smokeless tobacco. She reports that she does not drink alcohol  or use drugs.    Objective: Vitals:   10/12/19 1032  BP: 120/78  Pulse: 78  Temp: 97.8 F (36.6 C)  TempSrc: Temporal  SpO2: 95%  Weight: 196 lb 6.4 oz (89.1 kg)  Height: 4' 11"  (1.499 m)    Body mass index is 39.67  kg/m.  Physical Exam Vitals reviewed.  Constitutional:      Appearance: Normal appearance. She is obese.  HENT:     Head: Normocephalic and atraumatic.  Cardiovascular:     Rate and Rhythm: Normal rate and regular rhythm.     Heart sounds: Normal heart sounds.  Pulmonary:     Effort: Pulmonary effort is normal.     Breath sounds: Normal breath sounds.  Abdominal:     General: Bowel sounds are normal.     Palpations: Abdomen is soft.  Musculoskeletal:        General: Tenderness (point tenderness over right rhomboid/medial inferior aspect of rhomboid) present.  Skin:    Comments: 3 lesions on back (2 AK and one SK)  Neurological:     General: No focal deficit present.     Mental Status: She is alert and oriented to person, place, and time.  Psychiatric:        Mood and Affect: Mood normal.        Behavior: Behavior normal.        Cryotherapy procedure note Verbal consent obtained. 2 light cycles of 10 seconds applied to 3 lesions of concern. One biopsy proven AK. Tolerated well. Wound care given.   Assessment/plan: 1. Actinic keratosis Biopsy proven. Cryotherapy x 3 lesions. Tolerated well. Repeat if needed at follow up. Wound care given.   2. Weakness of both lower extremities Weight loss and strength training will be key for her. PT for help with transition and strength.  - Ambulatory referral to Physical Therapy  3. Pain of right scapula Point tenderness along right rhomboid after fall. Would xray, but not available today. Will give her a week trial of voltaren gel qid, muscle relaxer as needed ( drowsiness precautions given) heat and tylenol. If not improving, she will let me know and ill order xrays for her. PT also may be beneficial for this.      This visit occurred during the SARS-CoV-2 public health emergency.  Safety protocols were in place, including screening questions prior to the visit, additional usage of staff PPE, and extensive cleaning of exam room  while observing appropriate contact time as indicated for disinfecting solutions.    Return in about 4 months (around 02/12/2020) for regular htn/diabetes follow up. Orma Flaming, MD [place]   10/12/2019

## 2019-10-12 NOTE — Patient Instructions (Signed)
-  froze off 3 places on your back. Wound care given for you. If doesn't get better, we will need to freeze again if doesn't go away.   -PT ordered for your weakness. They will call you to set his up. It is here with Ander Purpura.   -for your back... I would order xrays, but we don't have xray tech today. Let's do conservative therapy with rest, heat, and muscle relaxer prn. Just be careful as this can make you drowsy. im sending in 64m pills, may increase to 472mif needed.   -get over the counter voltaren gel and rub on back 4x/week. Do not put where I froze. This is a topical anti inflammatory and works great. If back not improving, email or call so I can put in order for xray.

## 2019-10-15 ENCOUNTER — Other Ambulatory Visit: Payer: Self-pay

## 2019-10-15 ENCOUNTER — Ambulatory Visit: Payer: BC Managed Care – PPO | Attending: Family Medicine | Admitting: Physical Therapy

## 2019-10-15 ENCOUNTER — Encounter: Payer: Self-pay | Admitting: Physical Therapy

## 2019-10-15 DIAGNOSIS — R2681 Unsteadiness on feet: Secondary | ICD-10-CM | POA: Diagnosis not present

## 2019-10-15 DIAGNOSIS — M6281 Muscle weakness (generalized): Secondary | ICD-10-CM

## 2019-10-15 NOTE — Patient Instructions (Signed)
Access Code: [email or web address]: [email or web address]: 05/20/2021Prepared by: [place] rises with counter support - 1 x daily - 7 x weekly - 10 reps  Standing Hip Abduction with Counter Support - 1 x daily - 7 x weekly - 2 sets - 10 reps - 5 seconds hold  Standing Tandem Balance with Counter Support - 1 x daily - 7 x weekly - 10 reps - 3 sets   [facility] [address], [place], [place] [address] Phone # [phone] Fax [phone]

## 2019-10-15 NOTE — Therapy (Signed)
[facility] [address] [place], [place], [address] Phone: [phone]   Fax:  [phone]  Physical Therapy Evaluation  Patient Details  Name: Tara Garner MRN: [id number] Date of Birth: November 26, 1955 Referring Provider (PT): Orma Flaming, MD    Encounter Date: 10/15/2019  PT End of Session - 10/15/19 1628    Visit Number  1    Date for PT Re-Evaluation  12/25/19    Authorization Type  BCBS    Authorization Time Period  10/15/19 to 12/25/19    PT Start Time  1400    PT Stop Time  1440    PT Time Calculation (min)  40 min    Activity Tolerance  No increased pain;Patient tolerated treatment well    Behavior During Therapy  [facility] for tasks assessed/performed       Past Medical History:  Diagnosis Date  . Acid reflux   . Arthritis   . Asthma   . Cough due to ACE inhibitor   . Depression, recurrent ([place]) 09/16/2016  . Diabetes mellitus without complication ([place])   . DVT (deep venous thrombosis) ([place])   . Hypertension   . Morbid obesity ([place]) 09/16/2016  . Seasonal allergic rhinitis due to pollen 09/16/2016  . Ulcerative colitis [facility])     Past Surgical History:  Procedure Laterality Date  . ABDOMINAL HYSTERECTOMY    . BREAST BIOPSY Left    benign  . CATARACT EXTRACTION    . CESAREAN SECTION    . COLONOSCOPY  2012,2015,2016   Dr.Andreson and in [place]   . KNEE SURGERY Left   . SHOULDER SURGERY Right     There were no vitals filed for this visit.   Subjective Assessment - 10/15/19 1403    Subjective  Pt states that she has had a decline in her fitness/strength approximately 5 years ago following a series of hospitalizations. She also fell and injured her shoulder. She is not sure what exactly happened but was told to wear a sling and has not been able to raise her arm above her head since then. She is relatively inactive. 2 weeks ago, she fell and was unable to get up  from the floor. She would like to     Pertinent History  Lt knee surgery, DM, depression, arthritis    Patient Stated Goals  improve strength and safety    Currently in Pain?  No/denies         [facility] PT Assessment - 10/15/19 0001      Assessment   Medical Diagnosis  weakness of BLE    Referring Provider (PT)  Orma Flaming, MD     Prior Therapy  none       Precautions   Precautions  None      Restrictions   Weight Bearing Restrictions  No      Balance Screen   Has the patient fallen in the past 6 months  Yes    How many times?  1    Has the patient had a decrease in activity level because of a fear of falling?   No    Is the patient reluctant to leave their home because of a fear of falling?   No      Home Environment   Living Environment  Private residence    Living Arrangements  Spouse/significant other    Additional Comments  1 flight of stairs upstairs       Prior Function   Level  of Independence  Independent      Cognition   Overall Cognitive Status  Within Functional Limits for tasks assessed      Sensation   Additional Comments  neuropathy B feet       ROM / Strength   AROM / PROM / Strength  Strength      Strength   Strength Assessment Site  Hip    Right/Left Hip  Right;Left    Right Hip Flexion  3+/5    Right Hip Extension  3/5    Right Hip ABduction  3/5    Left Hip Flexion  3+/5    Left Hip Extension  3/5    Left Hip ABduction  3/5      Transfers   Five time sit to stand comments   13 sec, no UE support       Ambulation/Gait   Gait Comments  increased weight shift Lt and Rt, minimal push off       High Level Balance   High Level Balance Comments  SLS: Unable on Lt, 1-2 sec on Rt                   Objective measurements completed on examination: See above findings.      [facility] Adult PT Treatment/Exercise - 10/15/19 0001      Exercises   Exercises  Knee/Hip      Knee/Hip Exercises: Standing   Heel Raises  Both;1 set;10 reps    Other Standing Knee Exercises   single leg toe tap to the side x10 reps each, UE support on counter     Other Standing Knee Exercises  tandem stance x30 sec each side               PT Short Term Goals - 10/15/19 1437      PT SHORT TERM GOAL #1   Title  Pt will be independent with her initial HEP to improve LE strength.    Time  4    Period  Weeks    Status  New    Target Date  11/12/19        PT Long Term Goals - 10/15/19 1437      PT LONG TERM GOAL #1   Title  Pt will have atleast 4/5 MMT strength of the LE.    Time  8    Period  Weeks    Status  New      PT LONG TERM GOAL #2   Title  Pt will be able to complete the TUG in less than 12 sec without LOB, to reflect improvements in steadiness with walking and transitions.    Time  8    Period  Weeks    Status  New      PT LONG TERM GOAL #3   Title  Pt will have improved proprioception of the LE, evident by her ability to maintain SLS on each LE for atleast 3 seconds, 2/3 trials.    Time  8    Period  Weeks    Status  New      PT LONG TERM GOAL #4   Title  Pt will be able to complete floor to stand transition with no more than MinA, x2 trials, to assist with getting up from the floor after a fall.    Time  8    Period  Weeks    Status  New  Plan - 10/15/19 1444    Clinical Impression Statement  Pt is a pleasant 64 y.o F referred to OPPT with concerns of increasing LE weakness and unsteadiness on her feet over the past several years. She fell 2 weeks ago and was unable to get up from the floor secondary to her LE weakness. She has no greater than 3/5 MMT strength of the LEs and has difficulty ambulating community distances and completing stairs at her home. She has history of peripheral neuropathy as well as Lt midfood arthritis and claw toe further impacting her proprioception on the Lt. she is unable to maintain single leg stance for more than 1-2 seconds. She would benefit from skilled PT to safely progress her LE strength,  endurance and proprioception in order to decrease her risk of falls and facilitate participation in activity with her family.    Personal Factors and Comorbidities  Age;Time since onset of injury/illness/exacerbation;Fitness;Comorbidity 1    Comorbidities  diabetes, neuropathy    Examination-Activity Limitations  Stairs;Locomotion Level;Transfers    Examination-Participation Restrictions  Other    Stability/Clinical Decision Making  Evolving/Moderate complexity    Clinical Decision Making  Moderate    Rehab Potential  Good    PT Frequency  2x / week    PT Duration  8 weeks    PT Treatment/Interventions  Aquatic Therapy;ADLs/Self Care Home Management;Cryotherapy;Moist Heat;Manual techniques;Neuromuscular re-education;Stair training;Gait training;Therapeutic exercise;Balance training;Therapeutic activities;Patient/family education    PT Next Visit Plan  progress LE strength; Nustep; LE proprioception    PT Home Exercise Plan  Access Code [email or web address]    Consulted and Agree with Plan of Care  Patient       Patient will benefit from skilled therapeutic intervention in order to improve the following deficits and impairments:  Abnormal gait, Decreased strength, Decreased endurance, Decreased activity tolerance, Decreased balance, Difficulty walking, Impaired flexibility  Visit Diagnosis: Muscle weakness (generalized)  Unsteadiness on feet     Problem List Patient Active Problem List   Diagnosis Date Noted  . Type 2 diabetes mellitus with diabetic neuropathy, unspecified ([place]) 04/30/2019  . Sensorineural hearing loss 01/29/2019  . Acquired claw toe, left 09/16/2017  . Posterior tibial tendinitis, right leg 09/16/2017  . Localized osteoarthritis of right shoulder 10/31/2016  . Diabetic peripheral neuropathy associated with type 2 diabetes mellitus ([place]) 10/23/2016  . Morbid obesity ([place]) 09/16/2016  . Seasonal allergic rhinitis due to pollen 09/16/2016  . Gastroesophageal reflux disease  without esophagitis 09/16/2016  . Depression, recurrent ([place]) 09/16/2016  . Hypertension associated with diabetes ([place]) 09/16/2016  . Moderate persistent asthma without complication 61/95/0932  . Ulcerative colitis with complication ([place]) 67/04/4579    4:29 PM,10/15/19 Sherol Dade PT, DPT [facility] [address] [place], [place], [address] Phone: [phone]   Fax:  [phone]  Name: Tara Garner MRN: [id number] Date of Birth: 1956/01/25

## 2019-10-23 ENCOUNTER — Other Ambulatory Visit: Payer: Self-pay

## 2019-10-23 ENCOUNTER — Ambulatory Visit: Payer: BC Managed Care – PPO | Admitting: Physical Therapy

## 2019-10-23 DIAGNOSIS — R2681 Unsteadiness on feet: Secondary | ICD-10-CM

## 2019-10-23 DIAGNOSIS — M6281 Muscle weakness (generalized): Secondary | ICD-10-CM

## 2019-10-23 NOTE — Therapy (Signed)
[facility] [address]  [place], [place], [address] Phone: [phone]   Fax:  [phone]  Physical Therapy Treatment  Patient Details  Name: Tara Garner MRN: [id number] Date of Birth: 08/20/1955 Referring Provider (PT): Orma Flaming, MD    Encounter Date: 10/23/2019  PT End of Session - 10/23/19 1014    Visit Number  2    Date for PT Re-Evaluation  12/25/19    Authorization Type  BCBS    Authorization Time Period  10/15/19 to 12/25/19    PT Start Time  0930    PT Stop Time  1008    PT Time Calculation (min)  38 min    Activity Tolerance  Patient tolerated treatment well       Past Medical History:  Diagnosis Date  . Acid reflux   . Arthritis   . Asthma   . Cough due to ACE inhibitor   . Depression, recurrent ([place]) 09/16/2016  . Diabetes mellitus without complication ([place])   . DVT (deep venous thrombosis) ([place])   . Hypertension   . Morbid obesity ([place]) 09/16/2016  . Seasonal allergic rhinitis due to pollen 09/16/2016  . Ulcerative colitis [facility])     Past Surgical History:  Procedure Laterality Date  . ABDOMINAL HYSTERECTOMY    . BREAST BIOPSY Left    benign  . CATARACT EXTRACTION    . CESAREAN SECTION    . COLONOSCOPY  2012,2015,2016   Dr.Andreson and in [place]   . KNEE SURGERY Left   . SHOULDER SURGERY Right     There were no vitals filed for this visit.  Subjective Assessment - 10/23/19 0935    Subjective  I had mouth surgery yesterday so my stomach is a little upset.  The heel raises are painful on my left claw toe.    Pertinent History  Lt knee surgery, DM, depression, arthritis    Currently in Pain?  No/denies                        [facility] Adult PT Treatment/Exercise - 10/23/19 0001      Therapeutic Activites    ADL's  sit to stand, hip hinge for reaching and lifting, walking, standing       Knee/Hip Exercises: Aerobic   Nustep  L1 seat 6 6 min    right UE  handle longer     Knee/Hip Exercises: Standing   Heel Raises Limitations  omitted from HEP secondary to toe pain    Hip Abduction  AROM;Right;Left;5 reps    Wall Squat Limitations  hip hinge standing in front of the mat 10x     Gait Training  counter top push ups 10x     Other Standing Knee Exercises  side stepping at the counter     Other Standing Knee Exercises  4 direction weight shift at the counter keeping feet flat 10x       Knee/Hip Exercises: Seated   Sit to Sand  10 reps             PT Education - 10/23/19 0959    Education Details  [id number]  counter side step, sit to stand; standing hip hinge; 4 way standing weight shift;  counter top push    Person(s) Educated  Patient    Methods  Explanation;Demonstration;Handout    Comprehension  Returned demonstration;Verbalized understanding       PT Short Term Goals - 10/15/19 1437  PT SHORT TERM GOAL #1   Title  Pt will be independent with her initial HEP to improve LE strength.    Time  4    Period  Weeks    Status  New    Target Date  11/12/19        PT Long Term Goals - 10/15/19 1437      PT LONG TERM GOAL #1   Title  Pt will have atleast 4/5 MMT strength of the LE.    Time  8    Period  Weeks    Status  New      PT LONG TERM GOAL #2   Title  Pt will be able to complete the TUG in less than 12 sec without LOB, to reflect improvements in steadiness with walking and transitions.    Time  8    Period  Weeks    Status  New      PT LONG TERM GOAL #3   Title  Pt will have improved proprioception of the LE, evident by her ability to maintain SLS on each LE for atleast 3 seconds, 2/3 trials.    Time  8    Period  Weeks    Status  New      PT LONG TERM GOAL #4   Title  Pt will be able to complete floor to stand transition with no more than MinA, x2 trials, to assist with getting up from the floor after a fall.    Time  8    Period  Weeks    Status  New            Plan - 10/23/19 6203     Clinical Impression Statement  The patient reports toe pain secondary to claw toe deformities on the left with heel raises so modified HEP for weight shifting with feet flat on the floor.  She has difficulty balancing secondary to peripheral neuropathy so recommend she do her HEP at the kitchen counter for safety.  Some accomodation also needed for her right shoulder which she she is unable to elevate > 50 degrees.  She is able to elevate it enough to do counter push ups and use it on the Nu-Step with a longer arm handle on that side.  Therapist monitoring response with all exercises and modifying as needed.    Examination-Activity Limitations  Stairs;Locomotion Level;Transfers    Rehab Potential  Good    PT Frequency  2x / week    PT Duration  8 weeks    PT Treatment/Interventions  Aquatic Therapy;ADLs/Self Care Home Management;Cryotherapy;Moist Heat;Manual techniques;Neuromuscular re-education;Stair training;Gait training;Therapeutic exercise;Balance training;Therapeutic activities;Patient/family education    PT Next Visit Plan  progress LE strength; Nustep (longer right arm handle); LE proprioception    PT Home Exercise Plan  Access Code [email or web address]       Patient will benefit from skilled therapeutic intervention in order to improve the following deficits and impairments:  Abnormal gait, Decreased strength, Decreased endurance, Decreased activity tolerance, Decreased balance, Difficulty walking, Impaired flexibility  Visit Diagnosis: Muscle weakness (generalized)  Unsteadiness on feet     Problem List Patient Active Problem List   Diagnosis Date Noted  . Type 2 diabetes mellitus with diabetic neuropathy, unspecified ([place]) 04/30/2019  . Sensorineural hearing loss 01/29/2019  . Acquired claw toe, left 09/16/2017  . Posterior tibial tendinitis, right leg 09/16/2017  . Localized osteoarthritis of right shoulder 10/31/2016  . Diabetic peripheral neuropathy associated with type 2 diabetes  mellitus (  [place]) 10/23/2016  . Morbid obesity ([place]) 09/16/2016  . Seasonal allergic rhinitis due to pollen 09/16/2016  . Gastroesophageal reflux disease without esophagitis 09/16/2016  . Depression, recurrent ([place]) 09/16/2016  . Hypertension associated with diabetes ([place]) 09/16/2016  . Moderate persistent asthma without complication 94/58/5929  . Ulcerative colitis with complication ([place]) 24/46/2863   Ruben Im, PT 10/23/19 12:30 PM Phone: [phone] Fax: [phone] Alvera Singh 10/23/2019, 12:30 PM  [facility] [address] [place], [place], [address] Phone: [phone]   Fax:  [phone]  Name: Tara Garner MRN: [id number] Date of Birth: 12-15-1955

## 2019-10-23 NOTE — Patient Instructions (Signed)
Access Code: [email or web address] URL: [email or web address] Date: 10/23/2019 Prepared by: Ruben Im  Exercises Standing Hip Abduction with Counter Support - 1 x daily - 7 x weekly - 2 sets - 10 reps - 5 seconds hold Standing Tandem Balance with Counter Support - 1 x daily - 7 x weekly - 10 reps - 3 sets Sit to Stand - 1 x daily - 7 x weekly - 1 sets - 10 reps Push-Up on Counter - 1 x daily - 7 x weekly - 1 sets - 10 reps Standing Weight Shifting Forward and Backward - 1 x daily - 7 x weekly - 1 sets - 10 reps Standing Weight Shift Side to Side - 1 x daily - 7 x weekly - 1 sets - 10 reps Side Stepping with Counter Support - 1 x daily - 7 x weekly - 1 sets - 5 reps Standing Hip Hinge - 1 x daily - 7 x weekly - 1 sets - 10 reps

## 2019-10-28 ENCOUNTER — Telehealth: Payer: Self-pay

## 2019-10-28 NOTE — Telephone Encounter (Signed)
Patient calling and states that her most recent prescription for gabapentin 300 MG is for 2 capsules a day. States that she sometimes needs to take 3 total capsules a day because the pain is so bad. Please advise.   [id number] , [place] [address] CB#: [phone]

## 2019-10-28 NOTE — Telephone Encounter (Signed)
Please Advise

## 2019-10-29 MED ORDER — GABAPENTIN 300 MG PO CAPS: 600.0000 mg | ORAL_CAPSULE | Freq: Every day | ORAL | 1 refills | Status: DC

## 2019-10-29 MED ORDER — GABAPENTIN 100 MG PO CAPS: 100.0000 mg | ORAL_CAPSULE | Freq: Three times a day (TID) | ORAL | 1 refills | Status: DC

## 2019-10-29 NOTE — Telephone Encounter (Signed)
Let her know I resent px for the 167m capsule for THREE times a day and gave her a 90 day supply. Updated correct sig in her chart as well.   Also on 302mpills at night correct? 60056mor sleep?  Dr. WolRogers Blocker

## 2019-10-29 NOTE — Telephone Encounter (Signed)
Okay melitta- Please call pharmacy and cancel the gabapentin 150m px is sent in today.  I just sent in another 3039mpx, see if this pharmacy has this.   Thanks,  Dr. WoRogers Blocker

## 2019-10-29 NOTE — Telephone Encounter (Signed)
Spoke with the pharmacy to give message below. Gabapentin 100 mg will be cancelled, and they have received gabapentin 331m.

## 2019-10-29 NOTE — Telephone Encounter (Signed)
Patient states that she takes   1  100 mg gabapentin in the morning 2  300 mg gabapentin at night  Sometimes she will take an addition 300 mg gabapentin around 5-6 PM if she is in pain.   I advised that she should take her 100 mg gabapentin 3 times a day as prescribed by PCP and that will help with the pain she has in the afternoons. Patient states that she cannot and will not take 3 100 mg gabapentin tablets a day because they make her too sleepy. Asking for PCP to send in additional 90 days supply of 300 mg gabapentin for her "afternoon pain"

## 2019-10-30 ENCOUNTER — Ambulatory Visit: Payer: BC Managed Care – PPO | Admitting: Physical Therapy

## 2019-11-03 ENCOUNTER — Ambulatory Visit: Payer: BC Managed Care – PPO | Attending: Family Medicine | Admitting: Physical Therapy

## 2019-11-03 ENCOUNTER — Other Ambulatory Visit: Payer: Self-pay

## 2019-11-03 DIAGNOSIS — R2681 Unsteadiness on feet: Secondary | ICD-10-CM | POA: Insufficient documentation

## 2019-11-03 DIAGNOSIS — M6281 Muscle weakness (generalized): Secondary | ICD-10-CM | POA: Diagnosis not present

## 2019-11-03 NOTE — Therapy (Signed)
[facility] [address], [place], [place], [address] Phone: [phone]   Fax:  [phone]  Physical Therapy Treatment  Patient Details  Name: Tara Garner MRN: [id number] Date of Birth: 1955/06/08 Referring Provider (PT): Orma Flaming, MD    Encounter Date: 11/03/2019  PT End of Session - 11/03/19 1550    Visit Number  3    Date for PT Re-Evaluation  12/25/19    Authorization Type  BCBS    Authorization Time Period  10/15/19 to 12/25/19    PT Start Time  1445    PT Stop Time  1523    PT Time Calculation (min)  38 min    Activity Tolerance  Patient tolerated treatment well       Past Medical History:  Diagnosis Date  . Acid reflux   . Arthritis   . Asthma   . Cough due to ACE inhibitor   . Depression, recurrent ([place]) 09/16/2016  . Diabetes mellitus without complication ([place])   . DVT (deep venous thrombosis) ([place])   . Hypertension   . Morbid obesity ([place]) 09/16/2016  . Seasonal allergic rhinitis due to pollen 09/16/2016  . Ulcerative colitis [facility])     Past Surgical History:  Procedure Laterality Date  . ABDOMINAL HYSTERECTOMY    . BREAST BIOPSY Left    benign  . CATARACT EXTRACTION    . CESAREAN SECTION    . COLONOSCOPY  2012,2015,2016   Dr.Andreson and in [place]   . KNEE SURGERY Left   . SHOULDER SURGERY Right     There were no vitals filed for this visit.  Subjective Assessment - 11/03/19 1449    Subjective  Exercises going OK.  Still getting used to the ex's.  Some days better than others with arthritis and neuropathy.  I bought 2 3# weights.    Pertinent History  Lt knee surgery, DM, depression, arthritis    Patient Stated Goals  improve strength and safety    Currently in Pain?  No/denies                        [facility] Adult PT Treatment/Exercise - 11/03/19 0001      Therapeutic Activites    ADL's  sit to stand, hip hinge for reaching and lifting, walking, standing        Knee/Hip Exercises: Stretches   Active Hamstring Stretch  Right;Left;5 reps    Active Hamstring Stretch Limitations  seated     Hip Flexor Stretch  Right;Left;5 reps    Hip Flexor Stretch Limitations  seated     Piriformis Stretch  Right;Left;5 reps    Piriformis Stretch Limitations  seated       Knee/Hip Exercises: Aerobic   Nustep  L1 seat 6 8 min    right UE handle longer     Knee/Hip Exercises: Standing   Forward Step Up  Right;Left;5 reps;Hand Hold: 2;Step Height: 6"    Forward Step Up Limitations  UE support     Gait Training  counter top push ups 10x     Other Standing Knee Exercises  5# modified dead lift     Other Standing Knee Exercises  3# bil weight carry 25 feet x2      Knee/Hip Exercises: Seated   Other Seated Knee/Hip Exercises  5# seated dead lift 10x     Sit to Sand  10 reps   5#  PT Education - 11/03/19 1514    Education Details  [id number]  modified dead lifts 5#, bil3# weight carries, step ups; add weight to sit to stand;  discussed doing HEP in timed increments    Person(s) Educated  Patient    Methods  Explanation;Demonstration;Handout    Comprehension  Returned demonstration;Verbalized understanding       PT Short Term Goals - 10/15/19 1437      PT SHORT TERM GOAL #1   Title  Pt will be independent with her initial HEP to improve LE strength.    Time  4    Period  Weeks    Status  New    Target Date  11/12/19        PT Long Term Goals - 10/15/19 1437      PT LONG TERM GOAL #1   Title  Pt will have atleast 4/5 MMT strength of the LE.    Time  8    Period  Weeks    Status  New      PT LONG TERM GOAL #2   Title  Pt will be able to complete the TUG in less than 12 sec without LOB, to reflect improvements in steadiness with walking and transitions.    Time  8    Period  Weeks    Status  New      PT LONG TERM GOAL #3   Title  Pt will have improved proprioception of the LE, evident by her ability to maintain SLS on  each LE for atleast 3 seconds, 2/3 trials.    Time  8    Period  Weeks    Status  New      PT LONG TERM GOAL #4   Title  Pt will be able to complete floor to stand transition with no more than MinA, x2 trials, to assist with getting up from the floor after a fall.    Time  8    Period  Weeks    Status  New            Plan - 11/03/19 1509    Clinical Impression Statement  The patient needs some modifications secondary to right shoulder pain (unable to raise very high) and claw toes (omitted heel raises from HEP) but she is able to progress well with strength intensity with added weight/loading.  She is able to add step ups, suitcase carries, sit to stand and modified dead lifts without complaint of pain.  She does not require rest beaks during session but reports an appropriate level of general fatigue after treatment session.  Therapist monitoring response throughout treatment session.    Comorbidities  diabetes, neuropathy    Rehab Potential  Good    PT Frequency  2x / week    PT Duration  8 weeks    PT Treatment/Interventions  Aquatic Therapy;ADLs/Self Care Home Management;Cryotherapy;Moist Heat;Manual techniques;Neuromuscular re-education;Stair training;Gait training;Therapeutic exercise;Balance training;Therapeutic activities;Patient/family education    PT Next Visit Plan  progress LE strength; Nustep (longer right arm handle); LE proprioception    PT Home Exercise Plan  Access Code [email or web address]       Patient will benefit from skilled therapeutic intervention in order to improve the following deficits and impairments:  Abnormal gait, Decreased strength, Decreased endurance, Decreased activity tolerance, Decreased balance, Difficulty walking, Impaired flexibility  Visit Diagnosis: Muscle weakness (generalized)  Unsteadiness on feet     Problem List Patient Active Problem List   Diagnosis Date Noted  .  Type 2 diabetes mellitus with diabetic neuropathy, unspecified ([place])  04/30/2019  . Sensorineural hearing loss 01/29/2019  . Acquired claw toe, left 09/16/2017  . Posterior tibial tendinitis, right leg 09/16/2017  . Localized osteoarthritis of right shoulder 10/31/2016  . Diabetic peripheral neuropathy associated with type 2 diabetes mellitus ([place]) 10/23/2016  . Morbid obesity ([place]) 09/16/2016  . Seasonal allergic rhinitis due to pollen 09/16/2016  . Gastroesophageal reflux disease without esophagitis 09/16/2016  . Depression, recurrent ([place]) 09/16/2016  . Hypertension associated with diabetes ([place]) 09/16/2016  . Moderate persistent asthma without complication 98/92/1194  . Ulcerative colitis with complication ([place]) 17/40/8144   Ruben Im, PT 11/03/19 3:56 PM Phone: [phone] Fax: [phone] Alvera Singh 11/03/2019, 3:56 PM  [facility] [address] [place], [place], [address] Phone: [phone]   Fax:  [phone]  Name: Lakedra Washington MRN: [id number] Date of Birth: 1955/09/17

## 2019-11-03 NOTE — Patient Instructions (Signed)
Access Code: [email or web address] URL: [email or web address] Date: 11/03/2019 Prepared by: Ruben Im  Exercises Standing Hip Abduction with Counter Support - 1 x daily - 7 x weekly - 2 sets - 10 reps - 5 seconds hold Standing Tandem Balance with Counter Support - 1 x daily - 7 x weekly - 10 reps - 3 sets Standing Weight Shifting Forward and Backward - 1 x daily - 7 x weekly - 1 sets - 10 reps Standing Weight Shift Side to Side - 1 x daily - 7 x weekly - 1 sets - 10 reps Side Stepping with Counter Support - 1 x daily - 7 x weekly - 1 sets - 5 reps Standing Hip Hinge - 1 x daily - 7 x weekly - 1 sets - 10 reps Push-Up on Counter - 1 x daily - 7 x weekly - 1 sets - 10 reps Sit to Stand - 1 x daily - 7 x weekly - 1 sets - 10 reps Half Dead Lift with Kettlebell - 1 x daily - 7 x weekly - 1 sets - 10 reps Forward Step Up - 1 x daily - 7 x weekly - 1 sets - 10 reps Kettlebell Suitcase Carry - 1 x daily - 7 x weekly - 1 sets - 10 reps

## 2019-11-08 ENCOUNTER — Other Ambulatory Visit: Payer: Self-pay | Admitting: Family Medicine

## 2019-11-09 ENCOUNTER — Telehealth: Payer: Self-pay | Admitting: Family Medicine

## 2019-11-09 MED ORDER — BD PEN NEEDLE NANO U/F 32G X 4 MM MISC: 2 refills | Status: DC

## 2019-11-09 NOTE — Telephone Encounter (Signed)
Filled.  Tara Flaming, MD [place]

## 2019-11-09 NOTE — Telephone Encounter (Signed)
MEDICATION: Insulin Pen Needle (BD PEN NEEDLE NANO U/F) 32G X 4 MM MISC   PHARMACY:  [id number] [place] [facility] [address]:  [phone] Fax:  [phone]      Comments:   **Let patient know to contact pharmacy at the end of the day to make sure medication is ready. **  ** Please notify patient to allow 48-72 hours to process**  **Encourage patient to contact the pharmacy for refills or [facility]*

## 2019-11-12 ENCOUNTER — Encounter: Payer: Self-pay | Admitting: Physical Therapy

## 2019-11-12 ENCOUNTER — Ambulatory Visit: Payer: BC Managed Care – PPO | Admitting: Physical Therapy

## 2019-11-12 ENCOUNTER — Other Ambulatory Visit: Payer: Self-pay

## 2019-11-12 DIAGNOSIS — M6281 Muscle weakness (generalized): Secondary | ICD-10-CM | POA: Diagnosis not present

## 2019-11-12 DIAGNOSIS — R2681 Unsteadiness on feet: Secondary | ICD-10-CM | POA: Diagnosis not present

## 2019-11-12 NOTE — Therapy (Signed)
[facility] [address] [place], [place], [address] Phone: [phone]   Fax:  [phone]  Physical Therapy Treatment  Patient Details  Name: Tara Garner MRN: [id number] Date of Birth: [birth year] Referring Provider (PT): Orma Flaming, MD    Encounter Date: 11/12/2019   PT End of Session - 11/12/19 1353    Visit Number 4    Date for PT Re-Evaluation 12/25/19    Authorization Type BCBS    Authorization Time Period 10/15/19 to 12/25/19    PT Start Time 1355    PT Stop Time 1435    PT Time Calculation (min) 40 min    Activity Tolerance Patient tolerated treatment well    Behavior During Therapy [facility] for tasks assessed/performed           Past Medical History:  Diagnosis Date  . Acid reflux   . Arthritis   . Asthma   . Cough due to ACE inhibitor   . Depression, recurrent ([place]) 09/16/2016  . Diabetes mellitus without complication ([place])   . DVT (deep venous thrombosis) ([place])   . Hypertension   . Morbid obesity ([place]) 09/16/2016  . Seasonal allergic rhinitis due to pollen 09/16/2016  . Ulcerative colitis [facility])     Past Surgical History:  Procedure Laterality Date  . ABDOMINAL HYSTERECTOMY    . BREAST BIOPSY Left    benign  . CATARACT EXTRACTION    . CESAREAN SECTION    . COLONOSCOPY  2012,2015,2016   Dr.Andreson and in [place]   . KNEE SURGERY Left   . SHOULDER SURGERY Right     There were no vitals filed for this visit.   Subjective Assessment - 11/12/19 1353    Subjective I either did my 5lb exercises wrong or got aggravated in Lt upper back using a massage chair while having my toenails done.  I may need to avoid too much with weights today for the upper body.  I am working on all of the exercises that don't require the weights.  I feel like I may be getting more steady.    Pertinent History Lt knee surgery, DM, depression, arthritis    Patient Stated Goals improve strength and safety     Currently in Pain? Yes    Pain Score 3     Pain Location Thoracic    Pain Orientation Left    Pain Descriptors / Indicators Sore    Pain Type Acute pain    Aggravating Factors  using arms, weighted exercises in HEP                             [facility] Adult PT Treatment/Exercise - 11/12/19 0001      Exercises   Exercises Lumbar;Knee/Hip      Lumbar Exercises: Aerobic   Nustep L1 x 8', PT present to discuss symptoms and monitor added time on NuStep      Knee/Hip Exercises: Machines for Strengthening   Cybex Knee Flexion 25lb bil 1x10      Knee/Hip Exercises: Standing   Forward Step Up Left;Right;5 sets;Step Height: 6";Hand Hold: 2    Forward Step Up Limitations add 2nd step toe tap with step up    Rebounder 1' marching bil UE support, PT cued arch squeeze and avoid lateral trunk lean      Knee/Hip Exercises: Seated   Long Arc Quad Strengthening;Both;2 sets;10 reps;Weights    Long Arc Quad Weight 3  lbs.    Marching Strengthening;Both;20 reps;Weights    Marching Limitations holding 5lb each on bil LEs, PT cued don't lean back    Sit to Sand 10 reps;without UE support   hold 5lb at chest              Balance Exercises - 11/12/19 0001      Balance Exercises: Standing   Tandem Stance Eyes open;Intermittent upper extremity support;1 rep;15 secs   1x each foot position   Marching 10 reps;Intermittent upper extremity assist   toe taps on 6" step   Other Standing Exercises Comments weight shifts fwd/side/back in square pattern x 10 squares, no UE support, close supervision by PT               PT Short Term Goals - 11/12/19 1353      PT SHORT TERM GOAL #1   Title Pt will be independent with her initial HEP to improve LE strength.    Status On-going             PT Long Term Goals - 10/15/19 1437      PT LONG TERM GOAL #1   Title Pt will have atleast 4/5 MMT strength of the LE.    Time 8    Period Weeks    Status New      PT LONG TERM GOAL #2    Title Pt will be able to complete the TUG in less than 12 sec without LOB, to reflect improvements in steadiness with walking and transitions.    Time 8    Period Weeks    Status New      PT LONG TERM GOAL #3   Title Pt will have improved proprioception of the LE, evident by her ability to maintain SLS on each LE for atleast 3 seconds, 2/3 trials.    Time 8    Period Weeks    Status New      PT LONG TERM GOAL #4   Title Pt will be able to complete floor to stand transition with no more than MinA, x2 trials, to assist with getting up from the floor after a fall.    Time 8    Period Weeks    Status New                 Plan - 11/12/19 1402    Clinical Impression Statement Pt with some Lt upper back pain x 2-3 days after getting a pedicure and using a massage chair.  She is unsure whether the 5lb weights she is using in her HEP contributed.  She requested to work more on LEs and balance so she could rest the Lt upper back and avoid aggravation for a few more days.  Pt needed cueing to slow eccentric phases of LE strength.  She requires close supervision for standing balance tasks.  PT gave VCs to avoid lateral trunk lean with weight shifts and marching for better hip engagement.  Pt liked all ther ex today and left with "jelly legs" but no soreness or pain.  She is very motivated to get stronger and happy to be in a supervised environment for exercise progression.  Continue along POC.    Comorbidities diabetes, neuropathy    Examination-Activity Limitations Stairs;Locomotion Level;Transfers    Rehab Potential Good    PT Frequency 2x / week    PT Duration 8 weeks    PT Treatment/Interventions Aquatic Therapy;ADLs/Self Care Home Management;Cryotherapy;Moist Heat;Manual techniques;Neuromuscular re-education;Stair training;Gait  training;Therapeutic exercise;Balance training;Therapeutic activities;Patient/family education    PT Next Visit Plan f/u on Lt upper back pain to see if Pt ready  to resume UE/weighted ther ex, continue balance, foot/ankle proprioception, functional strength/endurance, LE and trunk strength progression    PT Home Exercise Plan Access Code [email or web address]    Consulted and Agree with Plan of Care Patient           Patient will benefit from skilled therapeutic intervention in order to improve the following deficits and impairments:  Abnormal gait, Decreased strength, Decreased endurance, Decreased activity tolerance, Decreased balance, Difficulty walking, Impaired flexibility  Visit Diagnosis: Muscle weakness (generalized)  Unsteadiness on feet     Problem List Patient Active Problem List   Diagnosis Date Noted  . Type 2 diabetes mellitus with diabetic neuropathy, unspecified ([place]) 04/30/2019  . Sensorineural hearing loss 01/29/2019  . Acquired claw toe, left 09/16/2017  . Posterior tibial tendinitis, right leg 09/16/2017  . Localized osteoarthritis of right shoulder 10/31/2016  . Diabetic peripheral neuropathy associated with type 2 diabetes mellitus ([place]) 10/23/2016  . Morbid obesity ([place]) 09/16/2016  . Seasonal allergic rhinitis due to pollen 09/16/2016  . Gastroesophageal reflux disease without esophagitis 09/16/2016  . Depression, recurrent ([place]) 09/16/2016  . Hypertension associated with diabetes ([place]) 09/16/2016  . Moderate persistent asthma without complication 29/92/4268  . Ulcerative colitis with complication ([place]) 34/19/6222    Fahmida Jurich, PT 11/12/19 2:37 PM   [facility] [address], [place], [place], [address] Phone: [phone]   Fax:  [phone]  Name: Tara Garner MRN: [id number] Date of Birth: 15-Oct-1955

## 2019-11-13 ENCOUNTER — Other Ambulatory Visit: Payer: Self-pay | Admitting: Family Medicine

## 2019-11-17 ENCOUNTER — Encounter: Payer: BC Managed Care – PPO | Admitting: Physical Therapy

## 2019-11-20 ENCOUNTER — Encounter: Payer: Self-pay | Admitting: Physical Therapy

## 2019-11-20 ENCOUNTER — Other Ambulatory Visit: Payer: Self-pay | Admitting: Family Medicine

## 2019-11-20 ENCOUNTER — Ambulatory Visit: Payer: BC Managed Care – PPO | Admitting: Physical Therapy

## 2019-11-20 ENCOUNTER — Other Ambulatory Visit: Payer: Self-pay

## 2019-11-20 DIAGNOSIS — M6281 Muscle weakness (generalized): Secondary | ICD-10-CM

## 2019-11-20 DIAGNOSIS — R2681 Unsteadiness on feet: Secondary | ICD-10-CM | POA: Diagnosis not present

## 2019-11-20 NOTE — Therapy (Signed)
[facility] [address], [place], [place], [address] Phone: [phone]   Fax:  [phone]  Physical Therapy Treatment  Patient Details  Name: Tara Garner MRN: [id number] Date of Birth: 1955-06-07 Referring Provider (PT): Orma Flaming, MD    Encounter Date: 11/20/2019   PT End of Session - 11/20/19 1016    Visit Number 5    Date for PT Re-Evaluation 12/25/19    Authorization Type BCBS    Authorization Time Period 10/15/19 to 12/25/19    PT Start Time 0930    PT Stop Time 1013    PT Time Calculation (min) 43 min    Activity Tolerance Patient tolerated treatment well           Past Medical History:  Diagnosis Date  . Acid reflux   . Arthritis   . Asthma   . Cough due to ACE inhibitor   . Depression, recurrent ([place]) 09/16/2016  . Diabetes mellitus without complication ([place])   . DVT (deep venous thrombosis) ([place])   . Hypertension   . Morbid obesity ([place]) 09/16/2016  . Seasonal allergic rhinitis due to pollen 09/16/2016  . Ulcerative colitis [facility])     Past Surgical History:  Procedure Laterality Date  . ABDOMINAL HYSTERECTOMY    . BREAST BIOPSY Left    benign  . CATARACT EXTRACTION    . CESAREAN SECTION    . COLONOSCOPY  2012,2015,2016   Dr.Andreson and in [place]   . KNEE SURGERY Left   . SHOULDER SURGERY Right     There were no vitals filed for this visit.   Subjective Assessment - 11/20/19 0934    Subjective Had my 42nd anniversary.  Did fine with exercises last time.  I haven't tried getting down or off the floor.  I'm scared to.    Pertinent History Lt knee surgery, DM, depression, arthritis    Currently in Pain? No/denies    Pain Score 0-No pain                             [facility] Adult PT Treatment/Exercise - 11/20/19 0001      Therapeutic Activites    ADL's practice quadruped on/off mat table to simulate part of getting up off the floor       Lumbar Exercises:  Aerobic   Nustep L1 seat 7 x 10 min, PT present to discuss symptoms and monitor added time on NuStep      Knee/Hip Exercises: Machines for Strengthening   Cybex Knee Flexion 25lb bil 1x10      Knee/Hip Exercises: Standing   Forward Step Up Left;Right;5 sets;Step Height: 6";Hand Hold: 2    Forward Step Up Limitations add 2nd step toe tap with step up; 3rd set of 5 holding 3# weight     Other Standing Knee Exercises 5# modified dead lift       Knee/Hip Exercises: Seated   Sit to Sand 10 reps;without UE support   hold 5lb at chest                 PT Education - 11/20/19 1009    Education Details [id number]  seated arm push up; LAQ with weight; seated march with weight; progression of step ups with 3# weight    Person(s) Educated Patient    Methods Explanation;Demonstration;Handout    Comprehension Returned demonstration;Verbalized understanding            PT  Short Term Goals - 11/20/19 1115      PT SHORT TERM GOAL #1   Title Pt will be independent with her initial HEP to improve LE strength.    Status Achieved             PT Long Term Goals - 10/15/19 1437      PT LONG TERM GOAL #1   Title Pt will have atleast 4/5 MMT strength of the LE.    Time 8    Period Weeks    Status New      PT LONG TERM GOAL #2   Title Pt will be able to complete the TUG in less than 12 sec without LOB, to reflect improvements in steadiness with walking and transitions.    Time 8    Period Weeks    Status New      PT LONG TERM GOAL #3   Title Pt will have improved proprioception of the LE, evident by her ability to maintain SLS on each LE for atleast 3 seconds, 2/3 trials.    Time 8    Period Weeks    Status New      PT LONG TERM GOAL #4   Title Pt will be able to complete floor to stand transition with no more than MinA, x2 trials, to assist with getting up from the floor after a fall.    Time 8    Period Weeks    Status New                 Plan - 11/20/19 1016     Clinical Impression Statement Components of getting up off the floor practice performed including pushing up with hands/knees position performed.  Patient has considerable knee pain with kneeling in quadruped so limited reps performed and verbally discussed with therapist demonstrating strategies for getting up off the floor.  Therapist closely monitoring response and modifying as needed.    Personal Factors and Comorbidities Age;Time since onset of injury/illness/exacerbation;Fitness;Comorbidity 1    Comorbidities diabetes, neuropathy    Stability/Clinical Decision Making Evolving/Moderate complexity    Rehab Potential Good    PT Frequency 2x / week    PT Duration 8 weeks    PT Treatment/Interventions Aquatic Therapy;ADLs/Self Care Home Management;Cryotherapy;Moist Heat;Manual techniques;Neuromuscular re-education;Stair training;Gait training;Therapeutic exercise;Balance training;Therapeutic activities;Patient/family education    PT Next Visit Plan going out of town next Friday;  components of getting up off the floor; LE strengthening           Patient will benefit from skilled therapeutic intervention in order to improve the following deficits and impairments:  Abnormal gait, Decreased strength, Decreased endurance, Decreased activity tolerance, Decreased balance, Difficulty walking, Impaired flexibility  Visit Diagnosis: Muscle weakness (generalized)  Unsteadiness on feet     Problem List Patient Active Problem List   Diagnosis Date Noted  . Type 2 diabetes mellitus with diabetic neuropathy, unspecified ([place]) 04/30/2019  . Sensorineural hearing loss 01/29/2019  . Acquired claw toe, left 09/16/2017  . Posterior tibial tendinitis, right leg 09/16/2017  . Localized osteoarthritis of right shoulder 10/31/2016  . Diabetic peripheral neuropathy associated with type 2 diabetes mellitus ([place]) 10/23/2016  . Morbid obesity ([place]) 09/16/2016  . Seasonal allergic rhinitis due to pollen  09/16/2016  . Gastroesophageal reflux disease without esophagitis 09/16/2016  . Depression, recurrent ([place]) 09/16/2016  . Hypertension associated with diabetes ([place]) 09/16/2016  . Moderate persistent asthma without complication 20/35/5974  . Ulcerative colitis with complication ([place]) 16/38/4536   Bindu Docter  Ji Feldner, [place] 11/20/19 11:17 AM Phone: [phone] Fax: [phone] Alvera Singh 11/20/2019, 11:17 AM  [facility] [address], [place], [place], [address] Phone: [phone]   Fax:  [phone]  Name: Tara Garner MRN: [id number] Date of Birth: 22-Sep-1955

## 2019-11-20 NOTE — Patient Instructions (Signed)
Access Code: [email or web address] URL: [email or web address] Date: 11/20/2019 Prepared by: Ruben Im  Exercises Standing Hip Abduction with Counter Support - 1 x daily - 7 x weekly - 2 sets - 10 reps - 5 seconds hold Standing Tandem Balance with Counter Support - 1 x daily - 7 x weekly - 10 reps - 3 sets Standing Weight Shifting Forward and Backward - 1 x daily - 7 x weekly - 1 sets - 10 reps Standing Weight Shift Side to Side - 1 x daily - 7 x weekly - 1 sets - 10 reps Side Stepping with Counter Support - 1 x daily - 7 x weekly - 1 sets - 5 reps Standing Hip Hinge - 1 x daily - 7 x weekly - 1 sets - 10 reps Push-Up on Counter - 1 x daily - 7 x weekly - 1 sets - 10 reps Sit to Stand - 1 x daily - 7 x weekly - 1 sets - 10 reps Half Dead Lift with Kettlebell - 1 x daily - 7 x weekly - 1 sets - 10 reps Kettlebell Suitcase Carry - 1 x daily - 7 x weekly - 1 sets - 10 reps Forward Step Up - 1 x daily - 7 x weekly - 1 sets - 10 reps Seated Long Arc Quad with Ankle Weight - 1 x daily - 7 x weekly - 2 sets - 10 reps Seated Hip Flexion March with Ankle Weights - 1 x daily - 7 x weekly - 1 sets - 10 reps Seated Single Arm Elbow Extension Push-up on Table - 1 x daily - 7 x weekly - 1 sets - 10 reps

## 2019-11-23 NOTE — Telephone Encounter (Signed)
Please review

## 2019-11-23 NOTE — Telephone Encounter (Signed)
Patient called and stated she needs her  metoprolol succinate (TOPROL-XL) 100 MG 24 hr tablet sent in for a refill

## 2019-11-24 ENCOUNTER — Ambulatory Visit: Payer: BC Managed Care – PPO | Admitting: Physical Therapy

## 2019-11-24 ENCOUNTER — Other Ambulatory Visit: Payer: Self-pay

## 2019-11-24 DIAGNOSIS — M6281 Muscle weakness (generalized): Secondary | ICD-10-CM

## 2019-11-24 DIAGNOSIS — R2681 Unsteadiness on feet: Secondary | ICD-10-CM | POA: Diagnosis not present

## 2019-11-24 NOTE — Therapy (Addendum)
[facility] [address] [place], [place], [address] Phone: [phone]   Fax:  [phone]  Physical Therapy Treatment/Discharge Summary   Patient Details  Name: Tara Garner MRN: [id number] Date of Birth: 1955-06-28 Referring Provider (PT): Orma Flaming, MD    Encounter Date: 11/24/2019   PT End of Session - 11/24/19 0842    Visit Number 6    Date for PT Re-Evaluation 12/25/19    Authorization Type BCBS    Authorization Time Period 10/15/19 to 12/25/19    PT Start Time 0800    PT Stop Time 0839    PT Time Calculation (min) 39 min    Activity Tolerance Patient tolerated treatment well           Past Medical History:  Diagnosis Date  . Acid reflux   . Arthritis   . Asthma   . Cough due to ACE inhibitor   . Depression, recurrent ([place]) 09/16/2016  . Diabetes mellitus without complication ([place])   . DVT (deep venous thrombosis) ([place])   . Hypertension   . Morbid obesity ([place]) 09/16/2016  . Seasonal allergic rhinitis due to pollen 09/16/2016  . Ulcerative colitis [facility])     Past Surgical History:  Procedure Laterality Date  . ABDOMINAL HYSTERECTOMY    . BREAST BIOPSY Left    benign  . CATARACT EXTRACTION    . CESAREAN SECTION    . COLONOSCOPY  2012,2015,2016   Dr.Andreson and in [place]   . KNEE SURGERY Left   . SHOULDER SURGERY Right     There were no vitals filed for this visit.                      [place] Adult PT Treatment/Exercise - 11/24/19 0001      Lumbar Exercises: Aerobic   Nustep L1 seat 7 x 10 min, PT present to discuss symptoms and treatment plan      Lumbar Exercises: Seated   Other Seated Lumbar Exercises seated red band modified dead lifts 10x       Knee/Hip Exercises: Seated   Other Seated Knee/Hip Exercises knee extension with red band 10x right/left     Other Seated Knee/Hip Exercises hip flexion with ankle DF red band 10x right/left     Sit to Sand 10  reps          Single leg press out red band 10x right/left  Seated red band clams 20x  Review of previous HEP       PT Education - 11/24/19 0842    Education Details red band seated ex's to use while traveling    Person(s) Educated Patient    Methods Explanation;Demonstration;Handout    Comprehension Returned demonstration;Verbalized understanding            PT Short Term Goals - 11/20/19 1115      PT SHORT TERM GOAL #1   Title Pt will be independent with her initial HEP to improve LE strength.    Status Achieved             PT Long Term Goals - 10/15/19 1437      PT LONG TERM GOAL #1   Title Pt will have atleast 4/5 MMT strength of the LE.    Time 8    Period Weeks    Status New      PT LONG TERM GOAL #2   Title Pt will be able to complete the TUG in  less than 12 sec without LOB, to reflect improvements in steadiness with walking and transitions.    Time 8    Period Weeks    Status New      PT LONG TERM GOAL #3   Title Pt will have improved proprioception of the LE, evident by her ability to maintain SLS on each LE for atleast 3 seconds, 2/3 trials.    Time 8    Period Weeks    Status New      PT LONG TERM GOAL #4   Title Pt will be able to complete floor to stand transition with no more than MinA, x2 trials, to assist with getting up from the floor after a fall.    Time 8    Period Weeks    Status New                 Plan - 11/24/19 0818    Clinical Impression Statement The paitent is going out of town for 2 weeks so focused on HEP she could do while she is away (with band or no special equipment).  She demonstrates good technique with these and they are not painful.  She has had to discontinue side push ups secondary to right shoulder chronic pathology.   Using the band on her feet does not aggravate her chronic foot issues.  Therapist closely monitoring response with all treatment interventions.    Comorbidities diabetes, neuropathy     Examination-Activity Limitations Stairs;Locomotion Level;Transfers    Rehab Potential Good    PT Frequency 2x / week    PT Duration 8 weeks    PT Treatment/Interventions Aquatic Therapy;ADLs/Self Care Home Management;Cryotherapy;Moist Heat;Manual techniques;Neuromuscular re-education;Stair training;Gait training;Therapeutic exercise;Balance training;Therapeutic activities;Patient/family education    PT Next Visit Plan will follow up in 2 weeks after her trip.  reassess progress toward goals to determine readiness for discharge vs. continued follow up for HEP progression    PT Home Exercise Plan Access Code [email or web address]           Patient will benefit from skilled therapeutic intervention in order to improve the following deficits and impairments:  Abnormal gait, Decreased strength, Decreased endurance, Decreased activity tolerance, Decreased balance, Difficulty walking, Impaired flexibility  Visit Diagnosis: Muscle weakness (generalized)  Unsteadiness on feet    PHYSICAL THERAPY DISCHARGE SUMMARY  Visits from Start of Care: 6  Current functional level related to goals / functional outcomes: The patient called on 7/12 to report she is feeling better and requests discharge from PT.   Remaining deficits: As above.  She did not return to recheck objective measurements.   Education / Equipment: HEP Plan: Patient agrees to discharge.  Patient goals were partially met. Patient is being discharged due to meeting the stated rehab goals.  ?????      Problem List Patient Active Problem List   Diagnosis Date Noted  . Type 2 diabetes mellitus with diabetic neuropathy, unspecified ([place]) 04/30/2019  . Sensorineural hearing loss 01/29/2019  . Acquired claw toe, left 09/16/2017  . Posterior tibial tendinitis, right leg 09/16/2017  . Localized osteoarthritis of right shoulder 10/31/2016  . Diabetic peripheral neuropathy associated with type 2 diabetes mellitus ([place]) 10/23/2016  . Morbid  obesity ([place]) 09/16/2016  . Seasonal allergic rhinitis due to pollen 09/16/2016  . Gastroesophageal reflux disease without esophagitis 09/16/2016  . Depression, recurrent ([place]) 09/16/2016  . Hypertension associated with diabetes ([place]) 09/16/2016  . Moderate persistent asthma without complication 38/46/6599  . Ulcerative colitis with complication ([place]) 35/70/1779  Ruben Im, PT 11/24/19 2:39 PM Phone: [phone] Fax: [phone] Alvera Singh 11/24/2019, 2:38 PM  [facility] [address] [place], [place], [address] Phone: [phone]   Fax:  [phone]  Name: Khristian Seals MRN: [id number] Date of Birth: [birth year]

## 2019-11-24 NOTE — Patient Instructions (Signed)
Access Code: [email or web address] URL: [email or web address] Date: 11/24/2019 Prepared by: Ruben Im  Exercises Standing Hip Abduction with Counter Support - 1 x daily - 7 x weekly - 2 sets - 10 reps - 5 seconds hold Standing Tandem Balance with Counter Support - 1 x daily - 7 x weekly - 10 reps - 3 sets Standing Weight Shifting Forward and Backward - 1 x daily - 7 x weekly - 1 sets - 10 reps Standing Weight Shift Side to Side - 1 x daily - 7 x weekly - 1 sets - 10 reps Side Stepping with Counter Support - 1 x daily - 7 x weekly - 1 sets - 5 reps Standing Hip Hinge - 1 x daily - 7 x weekly - 1 sets - 10 reps Push-Up on Counter - 1 x daily - 7 x weekly - 1 sets - 10 reps Sit to Stand - 1 x daily - 7 x weekly - 1 sets - 10 reps Half Dead Lift with Kettlebell - 1 x daily - 7 x weekly - 1 sets - 10 reps Kettlebell Suitcase Carry - 1 x daily - 7 x weekly - 1 sets - 10 reps Forward Step Up - 1 x daily - 7 x weekly - 1 sets - 10 reps Seated Long Arc Quad with Ankle Weight - 1 x daily - 7 x weekly - 2 sets - 10 reps Seated Hip Flexion March with Ankle Weights - 1 x daily - 7 x weekly - 1 sets - 10 reps Seated Single Arm Elbow Extension Push-up on Table - 1 x daily - 7 x weekly - 1 sets - 10 reps Seated Knee Extension with Resistance - 1 x daily - 7 x weekly - 1 sets - 10 reps Seated March with Resistance - 1 x daily - 7 x weekly - 1 sets - 10 reps Seated Hip Abduction with Resistance - 1 x daily - 7 x weekly - 1 sets - 10 reps Seated Shoulder Row with Resistance Anchored at Feet - 1 x daily - 7 x weekly - 1 sets - 10 reps Seated Leg Press with Resistance - 1 x daily - 7 x weekly - 1 sets - 10 reps

## 2019-11-26 ENCOUNTER — Ambulatory Visit: Payer: BC Managed Care – PPO | Admitting: Physical Therapy

## 2019-12-08 ENCOUNTER — Encounter: Payer: BC Managed Care – PPO | Admitting: Physical Therapy

## 2019-12-10 ENCOUNTER — Encounter: Payer: BC Managed Care – PPO | Admitting: Physical Therapy

## 2019-12-10 ENCOUNTER — Other Ambulatory Visit: Payer: Self-pay | Admitting: Family Medicine

## 2019-12-10 DIAGNOSIS — Z1231 Encounter for screening mammogram for malignant neoplasm of breast: Secondary | ICD-10-CM

## 2019-12-10 DIAGNOSIS — J301 Allergic rhinitis due to pollen: Secondary | ICD-10-CM

## 2019-12-22 ENCOUNTER — Other Ambulatory Visit: Payer: Self-pay

## 2019-12-22 ENCOUNTER — Telehealth (INDEPENDENT_AMBULATORY_CARE_PROVIDER_SITE_OTHER): Payer: BC Managed Care – PPO | Admitting: Family Medicine

## 2019-12-22 DIAGNOSIS — R0981 Nasal congestion: Secondary | ICD-10-CM

## 2019-12-22 DIAGNOSIS — J4521 Mild intermittent asthma with (acute) exacerbation: Secondary | ICD-10-CM | POA: Diagnosis not present

## 2019-12-22 DIAGNOSIS — R05 Cough: Secondary | ICD-10-CM | POA: Diagnosis not present

## 2019-12-22 DIAGNOSIS — R059 Cough, unspecified: Secondary | ICD-10-CM

## 2019-12-22 DIAGNOSIS — E1140 Type 2 diabetes mellitus with diabetic neuropathy, unspecified: Secondary | ICD-10-CM | POA: Diagnosis not present

## 2019-12-22 MED ORDER — ALBUTEROL SULFATE HFA 108 (90 BASE) MCG/ACT IN AERS
2.0000 | INHALATION_SPRAY | Freq: Four times a day (QID) | RESPIRATORY_TRACT | 0 refills | Status: DC | PRN
Start: 2019-12-22 — End: 2020-01-13

## 2019-12-22 MED ORDER — DOXYCYCLINE HYCLATE 100 MG PO TABS: 100.0000 mg | ORAL_TABLET | Freq: Two times a day (BID) | ORAL | 0 refills | Status: DC

## 2019-12-22 NOTE — Progress Notes (Signed)
Virtual Visit via Video Note  I connected with Tara Garner  on 12/22/19 at 10:40 AM EDT by a video enabled telemedicine application and verified that I am speaking with the correct person using two identifiers.  Location patient: home Location provider:work or home office Persons participating in the virtual visit: patient, provider  I discussed the limitations of evaluation and management by telemedicine and the availability of in person appointments. The patient expressed understanding and agreed to proceed.   HPI:  Acute visit for sinus issues: -started about 5 days ago -husband had a bad cold last week before her, he had a negative COVID test -she reports she stays home and is very isolated so she thinks got this from her husband -symptoms include sore throat, congestion, lots of coughing, coughing up a lot of green mucus, low grade temp 100.2, feeling tired -she has asthma and when gets sick her asthma flares up, her peak flow is down some -she has not used her inhaler as it is expired  -denies SOB, wheezing, NVD, loss of taste or smell -they had just gotten back from vacation - a family reunion - many members in the family got sick; they all tested several times for covid and all were negative  -she is fully vaccinated for COVID19  ROS: See pertinent positives and negatives per HPI.  Past Medical History:  Diagnosis Date  . Acid reflux   . Arthritis   . Asthma   . Cough due to ACE inhibitor   . Depression, recurrent ([place]) 09/16/2016  . Diabetes mellitus without complication ([place])   . DVT (deep venous thrombosis) ([place])   . Hypertension   . Morbid obesity ([place]) 09/16/2016  . Seasonal allergic rhinitis due to pollen 09/16/2016  . Ulcerative colitis [facility])     Past Surgical History:  Procedure Laterality Date  . ABDOMINAL HYSTERECTOMY    . BREAST BIOPSY Left    benign  . CATARACT EXTRACTION    . CESAREAN SECTION    . COLONOSCOPY  2012,2015,2016   Dr.Andreson and in [place]   . KNEE SURGERY Left   . SHOULDER SURGERY Right     Family History  Problem Relation Age of Onset  . Diabetes Mother   . Dementia Mother   . Stroke Mother   . Coronary artery disease Mother   . Cancer Brother   . Diverticulitis Brother   . Colon cancer Brother 53  . COPD Father   . Breast cancer Cousin   . Rectal cancer Neg Hx   . Stomach cancer Neg Hx     SOCIAL HX: see hpi   Current Outpatient Medications:  .  albuterol (PROAIR HFA) 108 (90 Base) MCG/ACT inhaler, Inhale 2 puffs into the lungs every 6 (six) hours as needed for wheezing or shortness of breath., Disp: 8.5 g, Rfl: 0 .  aspirin EC 81 MG tablet, Take 81 mg by mouth daily., Disp: , Rfl:  .  buPROPion (WELLBUTRIN XL) 150 MG 24 hr tablet, Take 1 tablet (150 mg total) by mouth daily., Disp: 90 tablet, Rfl: 1 .  Calcium Carbonate-Vitamin D (CALCIUM 500/D PO), Take 500 mg by mouth daily., Disp: , Rfl:  .  cholecalciferol (VITAMIN D) 1000 units tablet, Take 4,000 Units by mouth daily., Disp: , Rfl:  .  colestipol (COLESTID) 1 g tablet, Take 1 g by mouth 2 (two) times daily., Disp: , Rfl:  .  Cranberry 125 MG TABS, Take 1 tablet by mouth daily., Disp: , Rfl:  .  doxycycline (VIBRA-TABS) 100 MG tablet, Take 1 tablet (100 mg total) by mouth 2 (two) times daily., Disp: 14 tablet, Rfl: 0 .  gabapentin (NEURONTIN) 100 MG capsule, Take 1 capsule (100 mg total) by mouth 3 (three) times daily., Disp: 270 capsule, Rfl: 1 .  gabapentin (NEURONTIN) 300 MG capsule, Take 2 capsules (600 mg total) by mouth at bedtime., Disp: 180 capsule, Rfl: 1 .  Insulin Pen Needle (BD PEN NEEDLE NANO U/F) 32G X 4 MM MISC, USE WITH VICTOZA PEN, Disp: 100 each, Rfl: 2 .  loperamide (ANTI-DIARRHEAL) 2 MG tablet, Take 2 mg by mouth as needed for diarrhea or loose stools. , Disp: , Rfl:  .  Mesalamine 800 MG TBEC, Take 2,400 mg by mouth 2 (two) times daily., Disp: , Rfl:  .  metoprolol succinate (TOPROL-XL) 100 MG 24 hr tablet, TAKE 1 TABLET BY  MOUTH EVERY DAY WITH OR IMMEDIATELY FOLLOWING A MEAL, Disp: 90 tablet, Rfl: 2 .  montelukast (SINGULAIR) 10 MG tablet, TAKE 1 TABLET BY MOUTH EVERYDAY AT BEDTIME, Disp: 90 tablet, Rfl: 1 .  Multiple Vitamin (MULTIVITAMIN) tablet, Take 1 tablet daily by mouth., Disp: , Rfl:  .  pantoprazole (PROTONIX) 40 MG tablet, Take 40 mg by mouth daily., Disp: , Rfl:  .  tizanidine (ZANAFLEX) 2 MG capsule, Take 1 capsule (2 mg total) by mouth 3 (three) times daily., Disp: 30 capsule, Rfl: 1 .  VICTOZA 18 MG/3ML SOPN, INJECT 1.2 MG INTO THE SKIN DAILY AT THE SAME TIME, Disp: 18 pen, Rfl: 1  EXAM:  VITALS per patient if applicable:  GENERAL: alert, oriented, appears well and in no acute distress  HEENT: atraumatic, conjunttiva clear, no obvious abnormalities on inspection of external nose and ears  NECK: normal movements of the head and neck  LUNGS: on inspection no signs of respiratory distress, breathing rate appears normal, no obvious gross SOB, gasping or wheezing, occ cough during visit  CV: no obvious cyanosis  MS: moves all visible extremities without noticeable abnormality  PSYCH/NEURO: pleasant and cooperative, no obvious depression or anxiety, speech and thought processing grossly intact  ASSESSMENT AND PLAN:  Discussed the following assessment and plan:  Cough  Nasal congestion  Type 2 diabetes mellitus with diabetic neuropathy, without long-term current use of insulin (HCC)  Mild intermittent asthma with acute exacerbation  -we discussed possible serious and likely etiologies, options for evaluation and workup, limitations of telemedicine visit vs in person visit, treatment, treatment risks and precautions. Pt prefers to treat via telemedicine empirically rather then risking or undertaking an in person visit at this moment. She prefers to avoid predinsone as has diabetes and has had negative side effects in the past. We opted to treat with doxy due to sig increased discolored mucus  production, worsening with low grade temps and history. Possible pna vs viral infection with asthma exacerbation vs other. Also, albuterol prn. Given was at a recent event and risks she has opted to do [phone] testing as well - discussed limitations and risks, treatment options, potential complications. Lower likelihood given vaccinated. Patient agrees to seek prompt in person care if worsening, new symptoms arise, or if is not improving with treatment.   I discussed the assessment and treatment plan with the patient. The patient was provided an opportunity to ask questions and all were answered. The patient agreed with the plan and demonstrated an understanding of the instructions.   The patient was advised to call back or seek an in-person evaluation if the symptoms worsen  or if the condition fails to improve as anticipated.   Lucretia Kern, DO

## 2019-12-22 NOTE — Patient Instructions (Signed)
-  I sent the medication(s) we discussed to your pharmacy: Meds ordered this encounter  Medications  . doxycycline (VIBRA-TABS) 100 MG tablet    Sig: Take 1 tablet (100 mg total) by mouth 2 (two) times daily.    Dispense:  14 tablet    Refill:  0  . albuterol (PROAIR HFA) 108 (90 Base) MCG/ACT inhaler    Sig: Inhale 2 puffs into the lungs every 6 (six) hours as needed for wheezing or shortness of breath.    Dispense:  8.5 g    Refill:  0    Please let us know if you have any questions or concerns regarding this prescription.  I hope you are feeling better soon! Seek care promptly if your symptoms worsen, new concerns arise or you are not improving with treatment.

## 2019-12-23 DIAGNOSIS — Z20822 Contact with and (suspected) exposure to covid-19: Secondary | ICD-10-CM | POA: Diagnosis not present

## 2019-12-23 DIAGNOSIS — Z03818 Encounter for observation for suspected exposure to other biological agents ruled out: Secondary | ICD-10-CM | POA: Diagnosis not present

## 2020-01-11 ENCOUNTER — Ambulatory Visit
Admission: RE | Admit: 2020-01-11 | Discharge: 2020-01-11 | Disposition: A | Discharge status: Home / Self Care | Payer: BC Managed Care – PPO | Source: Ambulatory Visit | Attending: Family Medicine | Admitting: Family Medicine

## 2020-01-11 ENCOUNTER — Other Ambulatory Visit: Payer: Self-pay

## 2020-01-11 DIAGNOSIS — Z1231 Encounter for screening mammogram for malignant neoplasm of breast: Secondary | ICD-10-CM

## 2020-01-13 ENCOUNTER — Other Ambulatory Visit: Payer: Self-pay | Admitting: Family Medicine

## 2020-03-14 LAB — HM DIABETES EYE EXAM

## 2020-03-28 ENCOUNTER — Encounter: Payer: Self-pay | Admitting: Family Medicine

## 2020-04-12 ENCOUNTER — Encounter: Payer: Self-pay | Admitting: Family Medicine

## 2020-04-13 ENCOUNTER — Other Ambulatory Visit: Payer: Self-pay

## 2020-04-13 ENCOUNTER — Encounter: Payer: Self-pay | Admitting: Family Medicine

## 2020-04-13 ENCOUNTER — Ambulatory Visit: Payer: BC Managed Care – PPO | Admitting: Physician Assistant

## 2020-04-13 ENCOUNTER — Ambulatory Visit (INDEPENDENT_AMBULATORY_CARE_PROVIDER_SITE_OTHER): Payer: BC Managed Care – PPO | Admitting: Family Medicine

## 2020-04-13 VITALS — BP 117/56 | HR 82 | Temp 98.0°F | Ht 59.0 in | Wt 193.2 lb

## 2020-04-13 DIAGNOSIS — R310 Gross hematuria: Secondary | ICD-10-CM

## 2020-04-13 DIAGNOSIS — R8290 Unspecified abnormal findings in urine: Secondary | ICD-10-CM

## 2020-04-13 LAB — POCT URINALYSIS DIPSTICK
Bilirubin, UA: NEGATIVE
Blood, UA: NEGATIVE
Glucose, UA: NEGATIVE
Ketones, UA: NEGATIVE
Nitrite, UA: NEGATIVE
Protein, UA: NEGATIVE
Spec Grav, UA: 1.015 (ref 1.010–1.025)
Urobilinogen, UA: 0.2 E.U./dL
pH, UA: 5.5 (ref 5.0–8.0)

## 2020-04-13 MED ORDER — SULFAMETHOXAZOLE-TRIMETHOPRIM 800-160 MG PO TABS
1.0000 | ORAL_TABLET | Freq: Two times a day (BID) | ORAL | 0 refills | Status: DC
Start: 2020-04-13 — End: 2020-05-09

## 2020-04-13 NOTE — Patient Instructions (Addendum)
Sending in bactrim. Please drink plenty of water while on this medication.  Checking culture as well for urine.   If you have bleeding again please let me know!   So good seeing you!  Dr. Rogers Blocker

## 2020-04-13 NOTE — Progress Notes (Signed)
Patient: Tara Garner MRN: [id number] DOB: 1956/05/21 PCP: Orma Flaming, MD     Subjective:  Chief Complaint  Patient presents with  . Hematuria    When wiping   . cloudy urine    HPI: The patient is a 64 y.o. female who presents today for hematuria, and vaginal soreness. She also complains of cloudy urine. She states this has been going on since the end of last week. She states yesterday she wiped and it was red blood after urinating. she only had this happen once. The urine was also darker than normal. She also states its been progressively getting cloudy. She has had a complete hysterectomy for bleeding. No abnormal pap smears. Denies any vaginal bleeding. She has had some flank pain and some lower abdominal pain. It's not constant. No fever, but she has had some chills. No nausea or vomiting. No diarrhea. She has pain with wiping and a little rawness. Denies any itching. She has no urgency or frequency. No pain with sex.  On asa. No cardiac history. Will have her stop this.   Review of Systems  Constitutional: Positive for chills. Negative for fatigue and fever.  Genitourinary: Positive for hematuria and pelvic pain. Negative for difficulty urinating, dyspareunia, dysuria, flank pain, frequency, urgency, vaginal bleeding and vaginal pain.  Musculoskeletal: Positive for back pain.  Neurological: Negative for dizziness and headaches.    Allergies Patient is allergic to macrobid [nitrofurantoin macrocrystal], augmentin [amoxicillin-pot clavulanate], avelox [moxifloxacin], benicar [olmesartan], ceftin [cefuroxime], codeine, crestor [rosuvastatin], imuran [azathioprine], indocin [indomethacin], and methotrexate derivatives.  Past Medical History Patient  has a past medical history of Acid reflux, Arthritis, Asthma, Cough due to ACE inhibitor, Depression, recurrent ([place]) (09/16/2016), Diabetes mellitus without complication ([place]), DVT (deep venous thrombosis) ([place]), Hypertension, Morbid  obesity ([place]) (09/16/2016), Seasonal allergic rhinitis due to pollen (09/16/2016), and Ulcerative colitis ([place]).  Surgical History Patient  has a past surgical history that includes Cesarean section; Abdominal hysterectomy; Shoulder surgery (Right); Knee surgery (Left); Cataract extraction; Breast biopsy (Left); and Colonoscopy (2012,2015,2016).  Family History Pateint's family history includes Breast cancer in her cousin; COPD in her father; Cancer in her brother; Colon cancer (age of onset: 26) in her brother; Coronary artery disease in her mother; Dementia in her mother; Diabetes in her mother; Diverticulitis in her brother; Stroke in her mother.  Social History Patient  reports that she has never smoked. She has never used smokeless tobacco. She reports that she does not drink alcohol and does not use drugs.    Objective: Vitals:   04/13/20 1116  BP: (!) 117/56  Pulse: 82  Temp: 98 F (36.7 C)  TempSrc: Temporal  SpO2: 96%  Weight: 193 lb 3.2 oz (87.6 kg)  Height: 4' 11"  (1.499 m)    Body mass index is 39.02 kg/m.  Physical Exam Vitals reviewed. Exam conducted with a chaperone present.  Constitutional:      General: She is not in acute distress.    Appearance: Normal appearance. She is obese. She is not ill-appearing.  HENT:     Head: Normocephalic and atraumatic.  Cardiovascular:     Rate and Rhythm: Normal rate and regular rhythm.     Heart sounds: Normal heart sounds.  Pulmonary:     Effort: Pulmonary effort is normal.     Breath sounds: Normal breath sounds.  Abdominal:     General: Bowel sounds are normal. There is no distension.     Palpations: Abdomen is soft.     Tenderness: There is  no abdominal tenderness. There is no right CVA tenderness or left CVA tenderness.  Genitourinary:    General: Normal vulva.     Exam position: Knee-chest position.     Pubic Area: No rash.      Labia:        Right: No rash or lesion.        Left: No rash or lesion.       Urethra: No urethral lesion.     Vagina: Normal. No vaginal discharge.  Skin:    Capillary Refill: Capillary refill takes less than 2 seconds.  Neurological:     General: No focal deficit present.     Mental Status: She is alert and oriented to person, place, and time.  Psychiatric:        Mood and Affect: Mood normal.        Behavior: Behavior normal.    UA: 3+ LE    Assessment/plan: 1. Cloudy urine/gross hematuria  Concern for blood in history, but none in dip and no lesion or other abnormal finding on exam today. Could not do speculum. She has multiple drug allergies and is unsure about cirpo. Has tolerated sulfa drugs in past so will do course of bactrim. Renal function wnl GFR 50. Discussed to drink lots of water and we will wait for culture. If any other bleeding she is to let me know as discussed we may need to image if culture is negative. Precautions given.  - POCT Urinalysis Dipstick - Urine Culture; Future  Stopping ASA. No indication for this.   This visit occurred during the SARS-CoV-2 public health emergency.  Safety protocols were in place, including screening questions prior to the visit, additional usage of staff PPE, and extensive cleaning of exam room while observing appropriate contact time as indicated for disinfecting solutions.     Return in about 1 month (around 05/13/2020) for diabetes f/u .   Orma Flaming, MD [place]   04/13/2020

## 2020-04-14 LAB — URINE CULTURE
MICRO NUMBER:: 11215599
SPECIMEN QUALITY:: ADEQUATE

## 2020-04-23 ENCOUNTER — Other Ambulatory Visit: Payer: Self-pay | Admitting: Family Medicine

## 2020-05-09 ENCOUNTER — Other Ambulatory Visit: Payer: Self-pay

## 2020-05-09 ENCOUNTER — Encounter: Payer: Self-pay | Admitting: Family Medicine

## 2020-05-09 ENCOUNTER — Ambulatory Visit (INDEPENDENT_AMBULATORY_CARE_PROVIDER_SITE_OTHER): Payer: BC Managed Care – PPO | Admitting: Family Medicine

## 2020-05-09 VITALS — BP 152/80 | HR 69 | Temp 97.9°F | Ht 59.0 in | Wt 193.4 lb

## 2020-05-09 DIAGNOSIS — J301 Allergic rhinitis due to pollen: Secondary | ICD-10-CM | POA: Diagnosis not present

## 2020-05-09 DIAGNOSIS — Z23 Encounter for immunization: Secondary | ICD-10-CM

## 2020-05-09 DIAGNOSIS — E1140 Type 2 diabetes mellitus with diabetic neuropathy, unspecified: Secondary | ICD-10-CM | POA: Diagnosis not present

## 2020-05-09 DIAGNOSIS — N959 Unspecified menopausal and perimenopausal disorder: Secondary | ICD-10-CM

## 2020-05-09 DIAGNOSIS — E1159 Type 2 diabetes mellitus with other circulatory complications: Secondary | ICD-10-CM

## 2020-05-09 DIAGNOSIS — I152 Hypertension secondary to endocrine disorders: Secondary | ICD-10-CM

## 2020-05-09 DIAGNOSIS — M546 Pain in thoracic spine: Secondary | ICD-10-CM

## 2020-05-09 MED ORDER — GABAPENTIN 100 MG PO CAPS: 100.0000 mg | ORAL_CAPSULE | Freq: Three times a day (TID) | ORAL | 1 refills | Status: AC

## 2020-05-09 MED ORDER — MONTELUKAST SODIUM 10 MG PO TABS: ORAL_TABLET | ORAL | 3 refills | Status: AC

## 2020-05-09 MED ORDER — CYCLOBENZAPRINE HCL 5 MG PO TABS: 5.0000 mg | ORAL_TABLET | Freq: Three times a day (TID) | ORAL | 0 refills | Status: DC | PRN

## 2020-05-09 MED ORDER — BUPROPION HCL ER (XL) 150 MG PO TB24: 150.0000 mg | ORAL_TABLET | Freq: Every day | ORAL | 3 refills | Status: AC

## 2020-05-09 NOTE — Patient Instructions (Signed)
-  your blood pressure is elevated today. I would get an arm cuff and start a log to make sure your pressure is better. i'll see you back in a month to check on this.   -routine labs today  -for your back.. I have put in order for sports med. They will call you with this. Mainly I can't do xrays here and if they need to manipulate you they can do this. Continue voltaren gel, I sent in muscle relaxer, tylenol and heating pad.

## 2020-05-09 NOTE — Progress Notes (Signed)
Patient: Tara Garner MRN: [id number] DOB: January 24, 1956 PCP: Orma Flaming, MD     Subjective:  Chief Complaint  Patient presents with  . Diabetes  . Hypertension  . Back Pain    HPI: The patient is a 64 y.o. female who presents today for DM.  She complains of severe back pain. She says started 3 weeks ago.   Hypertension: Here for follow up of hypertension.  Currently on metoprolol 134m.  Takes medication as prescribed and denies any side effects. Exercise includes none. Weight has been increasing. Denies any chest pain, headaches, shortness of breath, vision changes, swelling in lower extremities.   Diabetes: Patient is here for follow up of type 2 diabetes. First diagnosed 2007.  Currently on the following medications victoza. Takes medications as prescribed. Highest sugar is 112. Last A1C was 6.0. Currently not exercising and following diabetic diet. Denies any hypoglycemic events. Denies any vision changes, nausea, vomiting, abdominal pain, ulcers/paraesthesia in feet, polyuria, polydipsia or polyphagia. Denies any chest pain, shortness of breath. Not on statin drug, allergy to crestor.    She has new complaints of back pain under her right shoulder blade. Started about 3 weeks ago. Really bad pain with valsalva movements e.g. cough, sneeze, laugh. She rates the pain as 7-8/10 all of the time. Pain is sharp. Sometimes can radiate along ribs anteriorly.  She denies any trauma or heavy work outs. She has worsening pain when she moves her arm in posterior direction. abduction and adduction do not hurt her. Twisting hurts her. She has taken tylenol for the pain and this doesn't help. No voltaren gel. She has done a heating pad. No new numbness, weakness in her arms/hands.   Overdue for bone scan. Also needs her pneumovax 23.   Review of Systems  Constitutional: Negative for chills, fatigue and fever.  HENT: Negative for dental problem, ear pain, hearing loss and trouble swallowing.    Eyes: Negative for visual disturbance.  Respiratory: Negative for cough, chest tightness and shortness of breath.   Cardiovascular: Negative for chest pain, palpitations and leg swelling.  Gastrointestinal: Negative for abdominal pain, blood in stool, diarrhea and nausea.  Endocrine: Negative for cold intolerance, polydipsia, polyphagia and polyuria.  Genitourinary: Negative for dysuria and hematuria.  Musculoskeletal: Positive for back pain. Negative for arthralgias.  Skin: Negative for rash.  Neurological: Negative for dizziness and headaches.  Psychiatric/Behavioral: Negative for dysphoric mood and sleep disturbance. The patient is not nervous/anxious.     Allergies Patient is allergic to macrobid [nitrofurantoin macrocrystal], augmentin [amoxicillin-pot clavulanate], avelox [moxifloxacin], benicar [olmesartan], ceftin [cefuroxime], codeine, crestor [rosuvastatin], imuran [azathioprine], indocin [indomethacin], and methotrexate derivatives.  Past Medical History Patient  has a past medical history of Acid reflux, Arthritis, Asthma, Cough due to ACE inhibitor, Depression, recurrent ([facility] (09/16/2016), Diabetes mellitus without complication ([facility], DVT (deep venous thrombosis) ([facility], Hypertension, Morbid obesity ([facility] (09/16/2016), Seasonal allergic rhinitis due to pollen (09/16/2016), and Ulcerative colitis ([facility].  Surgical History Patient  has a past surgical history that includes Cesarean section; Abdominal hysterectomy; Shoulder surgery (Right); Knee surgery (Left); Cataract extraction; Breast biopsy (Left); and Colonoscopy (2012,2015,2016).  Family History Pateint's family history includes Breast cancer in her cousin; COPD in her father; Cancer in her brother; Colon cancer (age of onset: 580 in her brother; Coronary artery disease in her mother; Dementia in her mother; Diabetes in her mother; Diverticulitis in her brother; Stroke in her mother.  Social History Patient  reports that she  has never smoked. She has never used smokeless  tobacco. She reports that she does not drink alcohol and does not use drugs.    Objective: Vitals:   05/09/20 1057 05/09/20 1137  BP: (!) 143/68 (!) 152/80  Pulse: 69   Temp: 97.9 F (36.6 C)   TempSrc: Temporal   SpO2: 99%   Weight: 193 lb 6.4 oz (87.7 kg)   Height: 4' 11"  (1.499 m)     Body mass index is 39.06 kg/m.  Physical Exam Vitals reviewed.  Constitutional:      General: She is not in acute distress.    Appearance: Normal appearance. She is obese. She is not ill-appearing.  HENT:     Head: Normocephalic and atraumatic.  Cardiovascular:     Rate and Rhythm: Normal rate and regular rhythm.     Heart sounds: Normal heart sounds.  Pulmonary:     Effort: Pulmonary effort is normal.     Breath sounds: Normal breath sounds.  Abdominal:     General: Bowel sounds are normal.     Palpations: Abdomen is soft.  Musculoskeletal:     Cervical back: Normal range of motion and neck supple.     Comments: TTP over right rhomboid/medial scapula area and along ribs moving in anterior direction.. T4-T5.   Skin:    General: Skin is warm.     Findings: No lesion or rash.  Neurological:     General: No focal deficit present.     Mental Status: She is alert and oriented to person, place, and time.  Psychiatric:        Mood and Affect: Mood normal.        Behavior: Behavior normal.        Assessment/plan: 1. Hypertension associated with diabetes ([place]) Above goal which is new for her. Typically well controlled. Asked that she get an arm cuff and start a log for me at home. Will f/u in one month with log and recheck. If running >150/90 at home she is to let me know so I can add on 2nd agent. Routine labs today. /fu in 1 month.  - CBC with Differential/Platelet; Future - CBC with Differential/Platelet  2. Type 2 diabetes mellitus with diabetic neuropathy, without long-term current use of insulin ([place]) -routine labs today -pneumovax  23 -foot exam at next visit -typically well controlled. Continue ozempic.  -limited exercise and no weight loss.  -f/u in 6 months (will be back in [place] at that time)  - Hemoglobin A1c; Future - Lipid panel; Future - COMPLETE METABOLIC PANEL WITH GFR; Future - COMPLETE METABOLIC PANEL WITH GFR - Lipid panel - Hemoglobin A1c  3. Menopausal and postmenopausal disorder  - DG Bone Density; Future  4. Need for vaccination for Strep pneumoniae  - Pneumococcal polysaccharide vaccine 23-valent greater than or equal to 2yo subcutaneous/IM  5. Acute right-sided thoracic back pain ? If subluxated rib vs. Scapular instability vs. Muscle strain. Referral to sports med for possible manipulation and xray. Recommended heat, voltaren gel and trial muscle relaxer. May need PT, but will let sports med eval.  - Ambulatory referral to Sports Medicine     This visit occurred during the SARS-CoV-2 public health emergency.  Safety protocols were in place, including screening questions prior to the visit, additional usage of staff PPE, and extensive cleaning of exam room while observing appropriate contact time as indicated for disinfecting solutions.    Return in about 1 month (around 06/09/2020) for blood pressure .    Orma Flaming, MD [place]  05/09/2020  

## 2020-05-10 LAB — CBC WITH DIFFERENTIAL/PLATELET
Absolute Monocytes: 525 cells/uL (ref 200–950)
Basophils Absolute: 107 cells/uL (ref 0–200)
Basophils Relative: 1.3 %
Eosinophils Absolute: 385 cells/uL (ref 15–500)
Eosinophils Relative: 4.7 %
HCT: 40.6 % (ref 35.0–45.0)
Hemoglobin: 13.4 g/dL (ref 11.7–15.5)
Lymphs Abs: 2706 cells/uL (ref 850–3900)
MCH: 30.6 pg (ref 27.0–33.0)
MCHC: 33 g/dL (ref 32.0–36.0)
MCV: 92.7 fL (ref 80.0–100.0)
MPV: 10.2 fL (ref 7.5–12.5)
Monocytes Relative: 6.4 %
Neutro Abs: 4477 cells/uL (ref 1500–7800)
Neutrophils Relative %: 54.6 %
Platelets: 307 10*3/uL (ref 140–400)
RBC: 4.38 10*6/uL (ref 3.80–5.10)
RDW: 13.3 % (ref 11.0–15.0)
Total Lymphocyte: 33 %
WBC: 8.2 10*3/uL (ref 3.8–10.8)

## 2020-05-10 LAB — COMPLETE METABOLIC PANEL WITH GFR
AG Ratio: 1.6 (calc) (ref 1.0–2.5)
ALT: 18 U/L (ref 6–29)
AST: 22 U/L (ref 10–35)
Albumin: 3.9 g/dL (ref 3.6–5.1)
Alkaline phosphatase (APISO): 71 U/L (ref 37–153)
BUN/Creatinine Ratio: 14 (calc) (ref 6–22)
BUN: 16 mg/dL (ref 7–25)
CO2: 27 mmol/L (ref 20–32)
Calcium: 9.2 mg/dL (ref 8.6–10.4)
Chloride: 107 mmol/L (ref 98–110)
Creat: 1.14 mg/dL — ABNORMAL HIGH (ref 0.50–0.99)
GFR, Est African American: 58 mL/min/{1.73_m2} — ABNORMAL LOW (ref >=60)
GFR, Est Non African American: 50 mL/min/{1.73_m2} — ABNORMAL LOW (ref >=60)
Globulin: 2.4 g/dL (calc) (ref 1.9–3.7)
Glucose, Bld: 105 mg/dL — ABNORMAL HIGH (ref 65–99)
Potassium: 4.7 mmol/L (ref 3.5–5.3)
Sodium: 141 mmol/L (ref 135–146)
Total Bilirubin: 0.4 mg/dL (ref 0.2–1.2)
Total Protein: 6.3 g/dL (ref 6.1–8.1)

## 2020-05-10 LAB — HEMOGLOBIN A1C
Hgb A1c MFr Bld: 6.3 % of total Hgb — ABNORMAL HIGH (ref <5.7)
Mean Plasma Glucose: 134 mg/dL
eAG (mmol/L): 7.4 mmol/L

## 2020-05-10 LAB — LIPID PANEL
Cholesterol: 156 mg/dL (ref <200)
HDL: 52 mg/dL (ref >=50)
LDL Cholesterol (Calc): 77 mg/dL (calc)
Non-HDL Cholesterol (Calc): 104 mg/dL (calc) (ref <130)
Total CHOL/HDL Ratio: 3 (calc) (ref <5.0)
Triglycerides: 172 mg/dL — ABNORMAL HIGH (ref <150)

## 2020-05-30 ENCOUNTER — Encounter: Payer: Self-pay | Admitting: Family Medicine

## 2020-05-30 ENCOUNTER — Ambulatory Visit (INDEPENDENT_AMBULATORY_CARE_PROVIDER_SITE_OTHER): Payer: BC Managed Care – PPO

## 2020-05-30 ENCOUNTER — Other Ambulatory Visit: Payer: Self-pay

## 2020-05-30 ENCOUNTER — Ambulatory Visit (INDEPENDENT_AMBULATORY_CARE_PROVIDER_SITE_OTHER): Payer: BC Managed Care – PPO | Admitting: Family Medicine

## 2020-05-30 VITALS — BP 138/84 | HR 74 | Ht 59.0 in | Wt 194.6 lb

## 2020-05-30 DIAGNOSIS — M546 Pain in thoracic spine: Secondary | ICD-10-CM

## 2020-05-30 MED ORDER — TIZANIDINE HCL 2 MG PO TABS: 2.0000 mg | ORAL_TABLET | Freq: Three times a day (TID) | ORAL | 1 refills | Status: AC | PRN

## 2020-05-30 NOTE — Patient Instructions (Addendum)
Thank you for coming in today.  I've referred you to Physical Therapy.  Let us know if you don't hear from them in one week.  Do some home work and let me know which PT location in [place] you would like to use. I can place a referral there nowish so that when you arrive you can start PT.   Please get an Xray today before you leave  Try the tizanidine mostly at bedtime.   Use heat and TENS unit.   TENS UNIT: This is helpful for muscle pain and spasm.   Search and Purchase a TENS 7000 2nd edition at  www.tenspros.com or www.[employer].com It should be less than $30.     TENS unit instructions: Do not shower or bathe with the unit on . Turn the unit off before removing electrodes or batteries . If the electrodes lose stickiness add a drop of water to the electrodes after they are disconnected from the unit and place on plastic sheet. If you continued to have difficulty, call the TENS unit company to purchase more electrodes. . Do not apply lotion on the skin area prior to use. Make sure the skin is clean and dry as this will help prolong the life of the electrodes. . After use, always check skin for unusual red areas, rash or other skin difficulties. If there are any skin problems, does not apply electrodes to the same area. . Never remove the electrodes from the unit by pulling the wires. . Do not use the TENS unit or electrodes other than as directed. . Do not change electrode placement without consultating your therapist or physician. Marland Kitchen Keep 2 fingers with between each electrode. . Wear time ratio is 2:1, on to off times.    For example on for 30 minutes off for 15 minutes and then on for 30 minutes off for 15 minutes    Let me know how this goes. I can help coordinate care after you move to [place].

## 2020-05-30 NOTE — Progress Notes (Signed)
Subjective:    I'm seeing this patient as a consultation for:  Dr. Rogers Blocker. Note will be routed back to referring provider/PCP.  CC: R-sided thoracic / scapular pain  I, Wendy Poet, LAT, ATC, am serving as scribe for Dr. Lynne Leader.  HPI: Pt is a 65 y/o female presenting w/ c/o R post thoracic/scapular pain that began over a month ago w/ no known MOI.  She locates her pain specifically to her R medial and inferior scapular border.  She notes that she is moving to [place] in 3 weeks.  She is from that area and is moving back home.  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  She notes she has persistent inability to fully abduct her right shoulder.  This has been ongoing for years and not new.  Radiating pain: No Aggravating factors: valsalva maneuvers; trunk rotation to the R; sitting Treatments tried: Tylenol; heat; Flexeril; changed pillows  Past medical history, Surgical history, Family history, Social history, Allergies, and medications have been entered into the medical record, reviewed. Ulcerative colitis, diabetes  Review of Systems: No new headache, visual changes, nausea, vomiting, diarrhea, constipation, dizziness, abdominal pain, skin rash, fevers, chills, night sweats, weight loss, swollen lymph nodes, body aches, joint swelling, muscle aches, chest pain, shortness of breath, mood changes, visual or auditory hallucinations.   Objective:    Vitals:   05/30/20 1055  BP: 138/84  Pulse: 74  SpO2: 98%   General: Well Developed, well nourished, and in no acute distress.  Neuro/Psych: Alert and oriented x3, extra-ocular muscles intact, able to move all 4 extremities, sensation grossly intact. Skin: Warm and dry, no rashes noted.  Respiratory: Not using accessory muscles, speaking in full sentences, trachea midline.  Cardiovascular: Pulses palpable, no extremity edema. Abdomen: Does not appear distended. MSK: T-spine  normal-appearing Nontender midline. Mildly tender palpation right thoracic paraspinal musculature. Within limits of shoulder motion scapular motion is intact. Strength is intact within limits of shoulder motion.  Right shoulder abduction limited to 100 degrees.  Lab and Radiology Results No results found for this or any previous visit (from the past 72 hour(s)). DG Thoracic Spine 2 View  Result Date: 05/30/2020 CLINICAL DATA:  Acute right-sided thoracic back pain. Evaluate right T-spine pain. Additional history provided: Patient reports pain under right shoulder blade for 6 weeks. EXAM: THORACIC SPINE 2 VIEWS COMPARISON:  No pertinent prior exams available for comparison. FINDINGS: Mild S shaped thoracolumbar scoliosis No appreciable thoracic vertebral compression fracture. Multilevel disc space narrowing greatest within the midthoracic spine and at L1-L2 and L2-L3. IMPRESSION: Mild S shaped thoracolumbar scoliosis. No appreciable thoracic vertebral compression fracture. Spondylosis greatest within the midthoracic spine, and at L1-L2 and L2-L3. Electronically Signed   By: Kellie Simmering DO   On: 05/30/2020 11:42  I, Lynne Leader, personally (independently) visualized and performed the interpretation of the images attached in this note.   Impression and Recommendations:    Assessment and Plan: 65 y.o. female with right thoracic back pain ongoing for about 4 weeks.  Patient does have some degenerative changes in her lumbar spine but not much in her thoracic spine.  She does have a little bit of scoliosis which may be contributory.  However I believe the main source of pain is muscle dysfunction.  She is a good candidate for physical therapy.  However the big wrinkle in the plan is that she is moving to [place] in 3 weeks.  This is not enough time to  have a dedicated physical therapy session.  Will place referral so that she can get started with some PT and possibly get some dry needling which may  be helpful.  However also I think we should order physical therapy to start in [place] when she moves there.  She will let me know which location she would like to use.  Switching from Flexeril to tizanidine.  Flexeril was too sedating.  Recheck back with me as needed.  Happy to continue care to a limited degree of [place] although she will need to transfer care to a provider in [place].Marland Kitchen  PDMP not reviewed this encounter. Orders Placed This Encounter  Procedures  . DG Thoracic Spine 2 View    Standing Status:   Future    Number of Occurrences:   1    Standing Expiration Date:   05/30/2021    Order Specific Question:   Reason for Exam (SYMPTOM  OR DIAGNOSIS REQUIRED)    Answer:   eval rt Tspine pain    Order Specific Question:   Preferred imaging location?    Answer:   [facility]  . Ambulatory referral to Physical Therapy    Referral Priority:   Routine    Referral Type:   Physical Medicine    Referral Reason:   Specialty Services Required    Requested Specialty:   Physical Therapy   Meds ordered this encounter  Medications  . tiZANidine (ZANAFLEX) 2 MG tablet    Sig: Take 1 tablet (2 mg total) by mouth every 8 (eight) hours as needed for muscle spasms.    Dispense:  30 tablet    Refill:  1    Discussed warning signs or symptoms. Please see discharge instructions. Patient expresses understanding.   The above documentation has been reviewed and is accurate and complete Lynne Leader, M.D.

## 2020-05-30 NOTE — Progress Notes (Signed)
X-ray thoracic spine does not show compression fracture or much problems in the thoracic spine.  The top part of the lower lumbar spine is visible which does show some arthritis and a bit of curvature in it.  This is almost certainly not causing your upper back pain.

## 2020-07-06 ENCOUNTER — Other Ambulatory Visit: Payer: Self-pay | Admitting: Family Medicine

## 2020-07-07 NOTE — Telephone Encounter (Signed)
  LAST APPOINTMENT DATE: 05/09/2020   NEXT APPOINTMENT DATE:@Visit  date not found  MEDICATION:Insulin Pen Needle (BD PEN NEEDLE NANO U/F) 32G X 4 MM MISC  PHARMACY:[facility] // Address: [address], [place] [address]  Comments: Patient states she has an appointment in April with the new doctor in [place].   Please advise

## 2020-10-12 IMAGING — MG DIGITAL SCREENING BILAT W/ TOMO W/ CAD
8 series · 8 of 24 positions shown · non-contrast
Comparison: Previous exam(s).

CLINICAL DATA: Screening.

EXAM:
DIGITAL SCREENING BILATERAL MAMMOGRAM WITH TOMO AND CAD

[R MLO synth-2D]
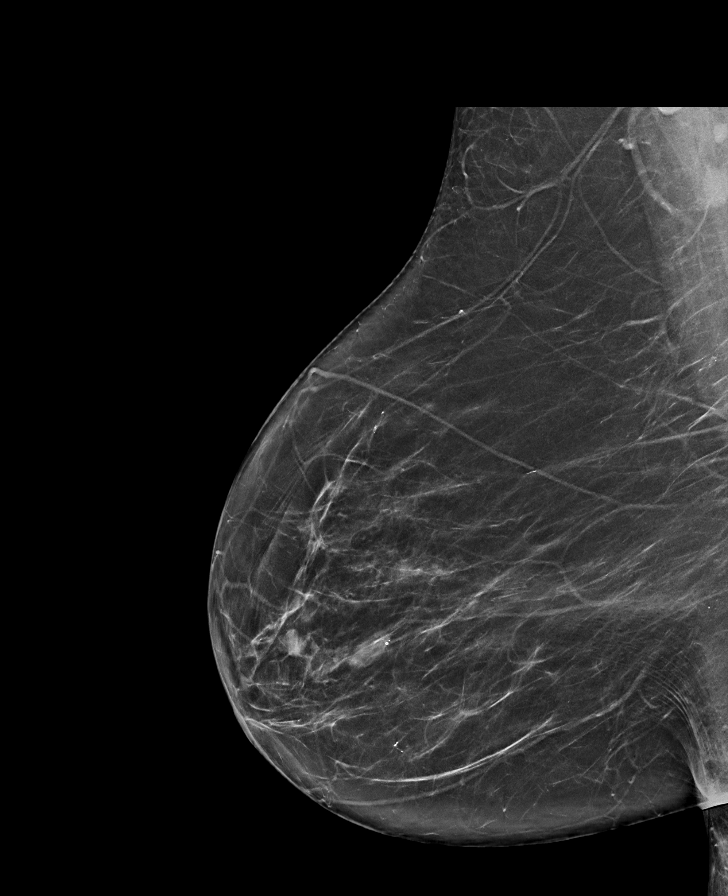

[L CC synth-2D]
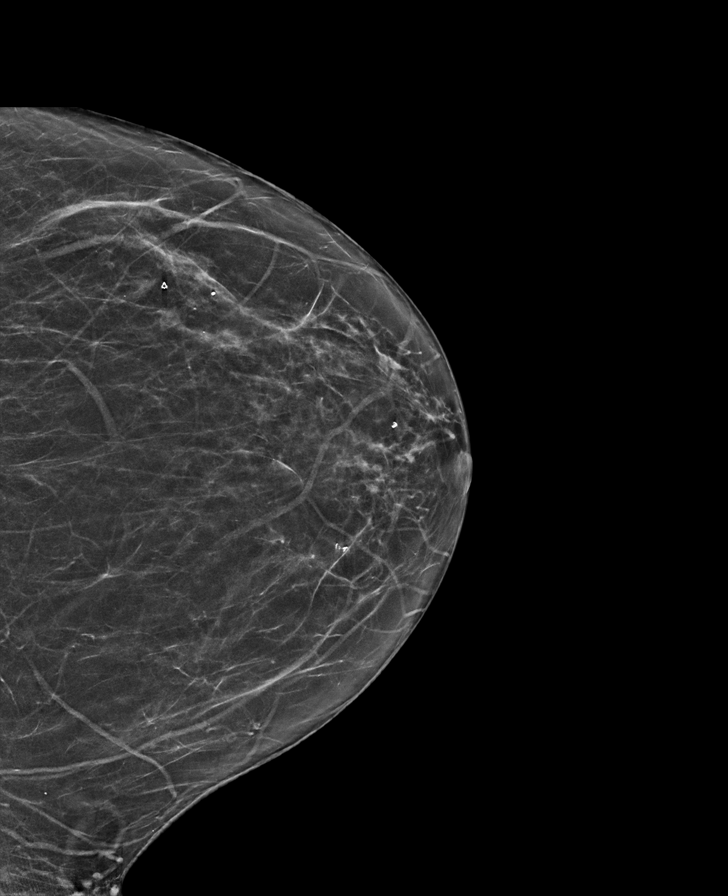

[R CC synth-2D]
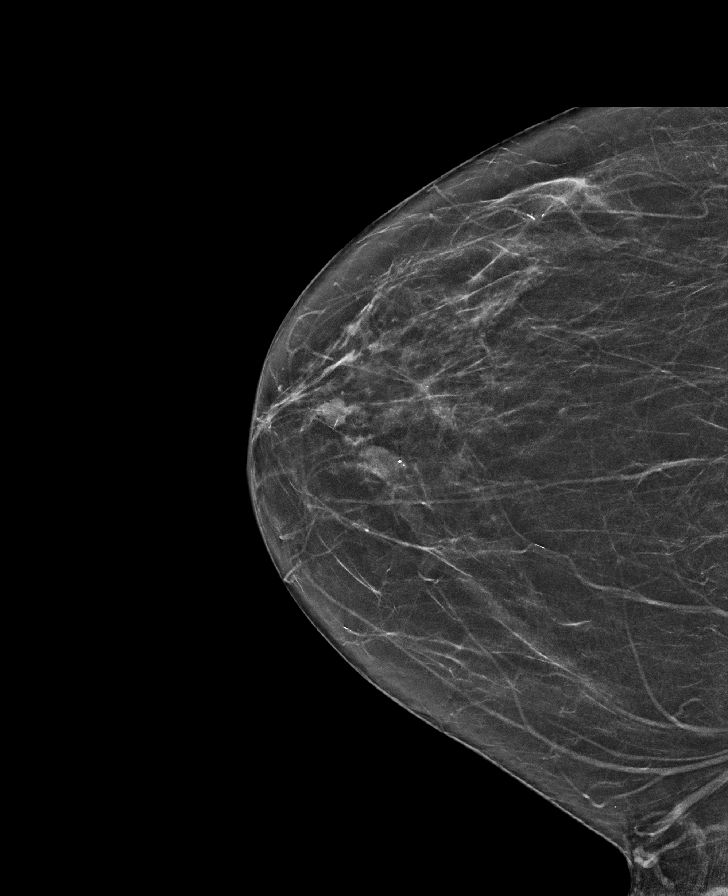

[L MLO synth-2D]
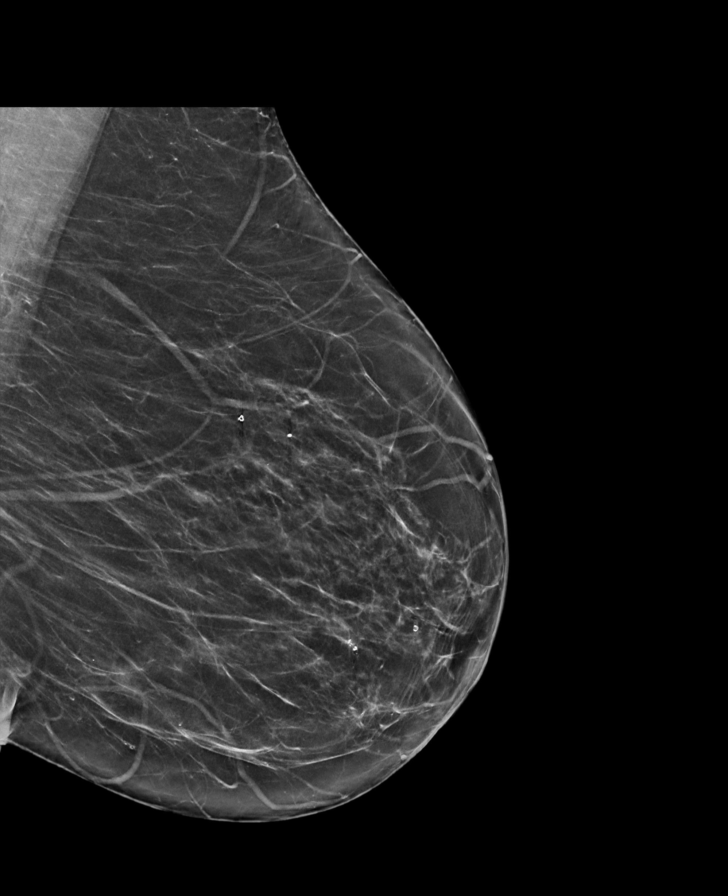

[R MLO tomo · tomo slice 39/77.0]
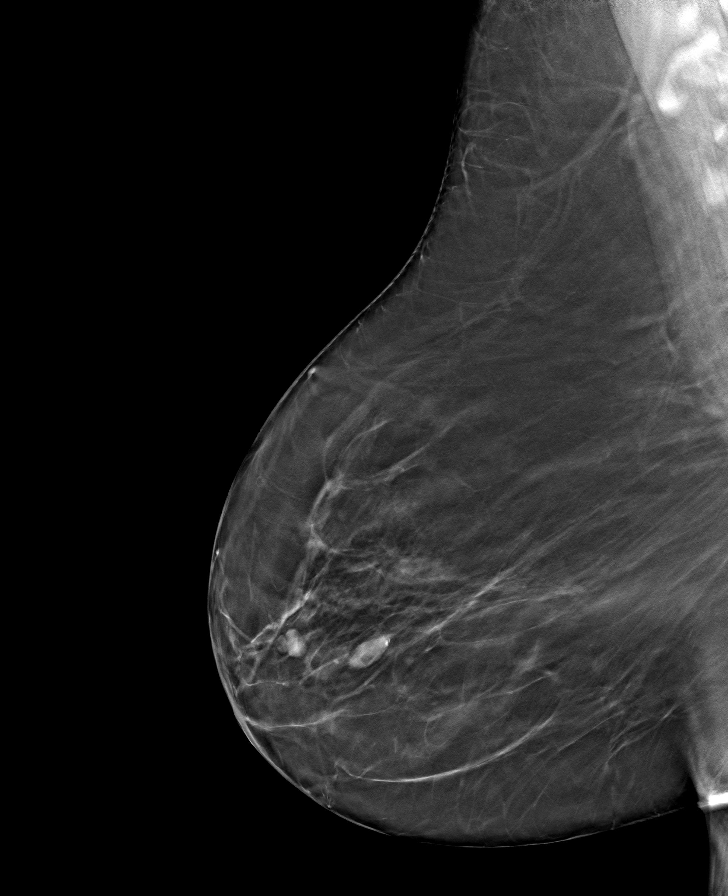

[L CC tomo · tomo slice 33/65.0]
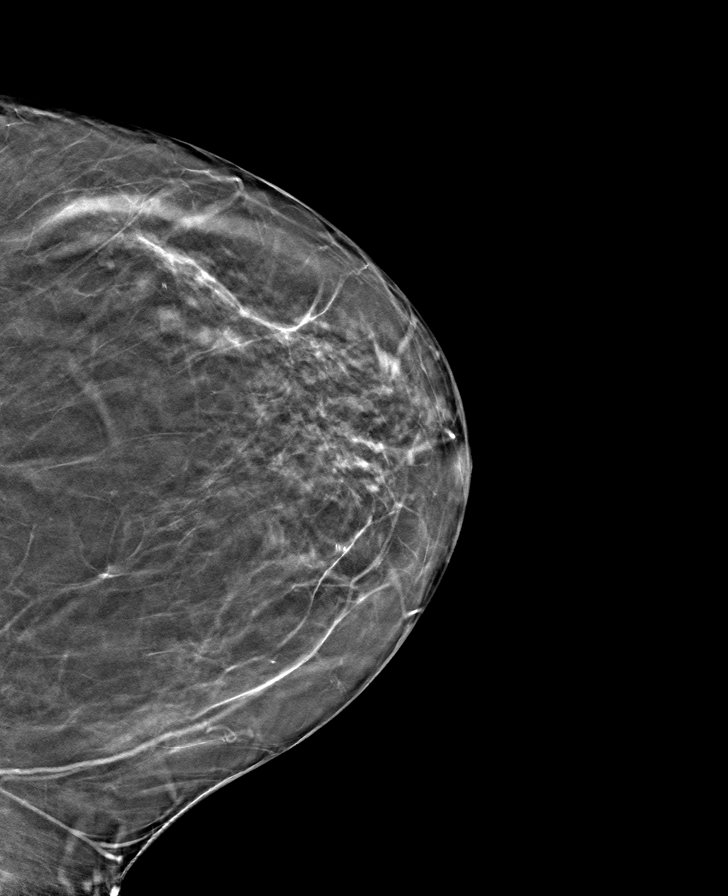

[L MLO tomo · tomo slice 35/70.0]
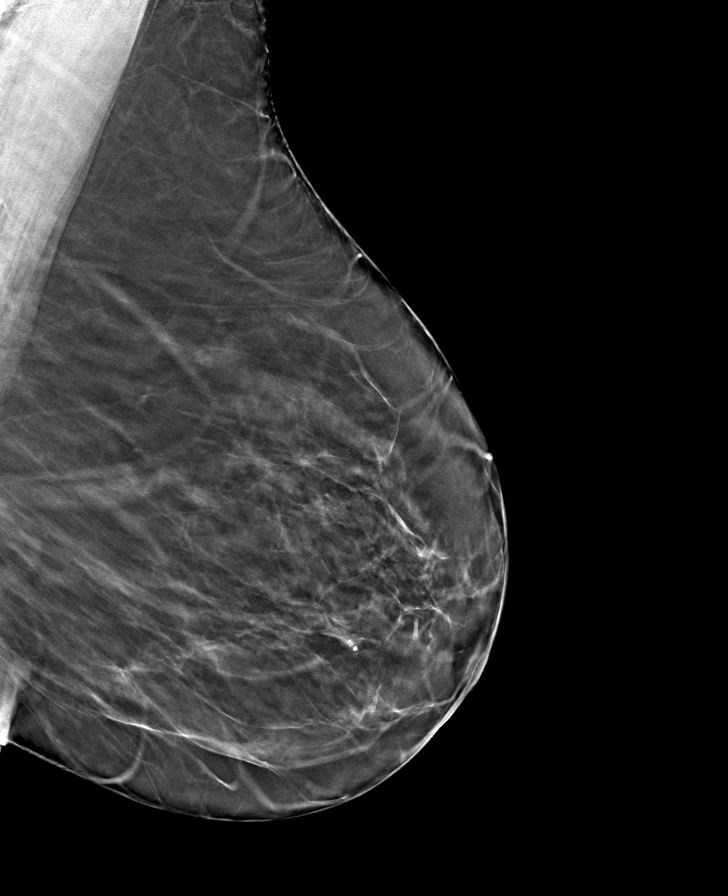

[R CC tomo · tomo slice 33/65.0]
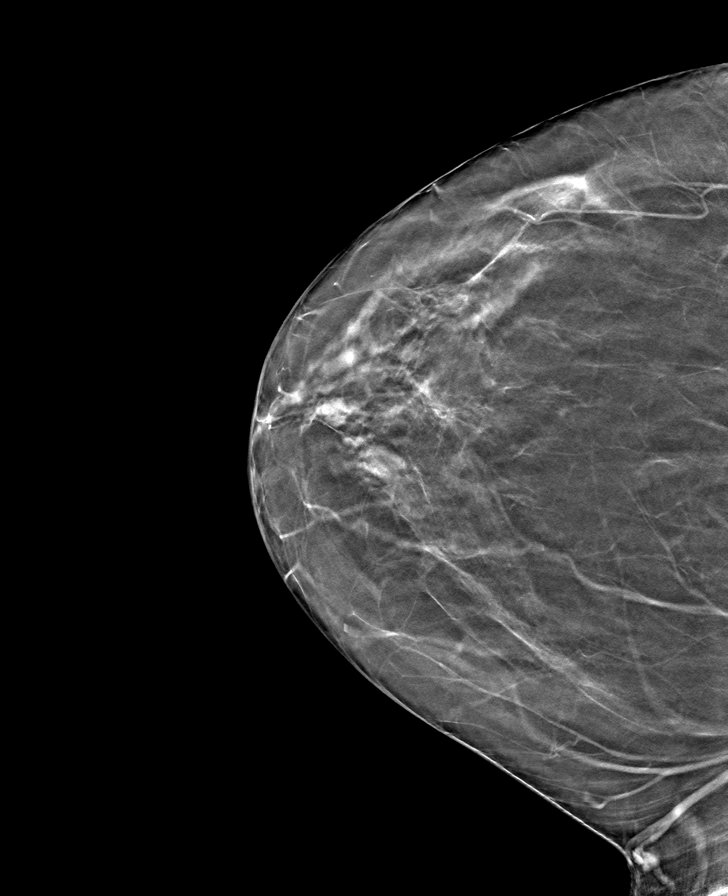

[8 of 24 positions shown; findings below may reference images not displayed]

ACR Breast Density Category b: There are scattered areas of
fibroglandular density.
FINDINGS: There are no findings suspicious for malignancy. Images were
processed with CAD.
IMPRESSION: No mammographic evidence of malignancy. A result letter of this
screening mammogram will be mailed directly to the patient.

RECOMMENDATION:
Screening mammogram in one year. (Code:[id number])

BI-RADS CATEGORY  1: Negative.

## 2020-12-27 ENCOUNTER — Other Ambulatory Visit: Payer: Self-pay | Admitting: Family Medicine

## 2020-12-27 ENCOUNTER — Other Ambulatory Visit: Payer: Self-pay

## 2021-04-26 ENCOUNTER — Other Ambulatory Visit: Payer: Self-pay | Admitting: Family Medicine

## 2021-04-26 DIAGNOSIS — J301 Allergic rhinitis due to pollen: Secondary | ICD-10-CM

## 2021-08-23 ENCOUNTER — Other Ambulatory Visit: Payer: Self-pay | Admitting: Family Medicine
